# Patient Record
Sex: Female | Born: 1985 | Race: Black or African American | Hispanic: No | Marital: Married | Smoking: Never smoker
Health system: Southern US, Community
[De-identification: ages and names within clinical notes are randomized; demographics above are authoritative.]

## PROBLEM LIST (undated history)

## (undated) ENCOUNTER — Inpatient Hospital Stay (HOSPITAL_COMMUNITY): Payer: Medicaid Other

## (undated) DIAGNOSIS — O24419 Gestational diabetes mellitus in pregnancy, unspecified control: Secondary | ICD-10-CM

## (undated) HISTORY — DX: Gestational diabetes mellitus in pregnancy, unspecified control: O24.419

---

## 2017-12-23 LAB — CHG URINE PREGNANCY TEST: PREG TEST UR: POSITIVE

## 2018-01-09 ENCOUNTER — Encounter: Payer: Self-pay | Admitting: *Deleted

## 2018-01-27 ENCOUNTER — Encounter: Payer: Self-pay | Admitting: Advanced Practice Midwife

## 2018-01-28 ENCOUNTER — Encounter: Payer: Self-pay | Admitting: *Deleted

## 2018-02-06 LAB — AMB REFERRAL TO OB-GYN
DRUG SCREEN, URINE: NEGATIVE
Glucose, 1 hour: 90
PAP SMEAR: NEGATIVE
URINE CULTURE, OB: NEGATIVE

## 2018-02-06 LAB — OB RESULTS CONSOLE GC/CHLAMYDIA
Chlamydia: NEGATIVE
Gonorrhea: NEGATIVE

## 2018-02-06 LAB — OB RESULTS CONSOLE VARICELLA ZOSTER ANTIBODY, IGG: Varicella: IMMUNE

## 2018-02-06 LAB — SICKLE CELL SCREEN: Sickle Cell Screen: NORMAL

## 2018-02-06 LAB — OB RESULTS CONSOLE HEPATITIS B SURFACE ANTIGEN: Hepatitis B Surface Ag: NEGATIVE

## 2018-02-06 LAB — CYSTIC FIBROSIS DIAGNOSTIC STUDY: Interpretation-CFDNA:: NEGATIVE

## 2018-02-06 LAB — OB RESULTS CONSOLE RPR: RPR: NONREACTIVE

## 2018-02-06 LAB — OB RESULTS CONSOLE ABO/RH: RH Type: POSITIVE

## 2018-02-06 LAB — OB RESULTS CONSOLE HGB/HCT, BLOOD
HCT: 36 (ref 29–41)
Hemoglobin: 11.7

## 2018-02-06 LAB — OB RESULTS CONSOLE ANTIBODY SCREEN: Antibody Screen: NEGATIVE

## 2018-02-06 LAB — OB RESULTS CONSOLE HIV ANTIBODY (ROUTINE TESTING): HIV: NONREACTIVE

## 2018-02-06 LAB — OB RESULTS CONSOLE PLATELET COUNT: Platelets: 291

## 2018-02-06 LAB — OB RESULTS CONSOLE RUBELLA ANTIBODY, IGM: Rubella: IMMUNE

## 2018-02-17 ENCOUNTER — Encounter: Payer: Self-pay | Admitting: Advanced Practice Midwife

## 2018-04-28 LAB — OB RESULTS CONSOLE HGB/HCT, BLOOD
HCT: 36 (ref 29–41)
Hemoglobin: 11.9

## 2018-04-28 LAB — AMB REFERRAL TO OB-GYN: GLUCOSE: 157

## 2018-04-28 LAB — OB RESULTS CONSOLE RPR: RPR: NONREACTIVE

## 2018-05-01 LAB — AMB REFERRAL TO OB-GYN: Glucose 3 Hour: 99

## 2018-05-06 ENCOUNTER — Telehealth: Payer: Self-pay | Admitting: Family Medicine

## 2018-05-06 ENCOUNTER — Telehealth: Payer: Self-pay | Admitting: General Practice

## 2018-05-06 NOTE — Telephone Encounter (Signed)
Attempted to call patient to let her know of the office restrictions due to the coronavirus. No answer, left detailed message with the restrictions along with the office number if needing to reschedule.

## 2018-05-06 NOTE — Telephone Encounter (Signed)
Patient called and left message stating she had a missed call from [place] so she is returning our call. Called patient and discussed that was just a reminder call for her appt. Informed her of appt time & reviewed current visitor restrictions. Patient verbalized understanding and asked if she needed to bring anything with her. Told patient just a picture ID and insurance card if she has one. Patient verbalized understanding & had no questions.

## 2018-05-08 ENCOUNTER — Encounter: Payer: Self-pay | Attending: Obstetrics & Gynecology | Admitting: *Deleted

## 2018-05-08 ENCOUNTER — Other Ambulatory Visit: Payer: Self-pay

## 2018-05-08 ENCOUNTER — Ambulatory Visit: Payer: Self-pay | Admitting: *Deleted

## 2018-05-08 DIAGNOSIS — Z713 Dietary counseling and surveillance: Secondary | ICD-10-CM | POA: Insufficient documentation

## 2018-05-08 DIAGNOSIS — R7302 Impaired glucose tolerance (oral): Secondary | ICD-10-CM

## 2018-05-08 DIAGNOSIS — O24410 Gestational diabetes mellitus in pregnancy, diet controlled: Secondary | ICD-10-CM | POA: Insufficient documentation

## 2018-05-08 LAB — AMB REFERRAL TO OB-GYN: Maternal quad screen for MFM: NEGATIVE

## 2018-05-08 NOTE — Progress Notes (Signed)
  Patient was seen on 05/08/2018 for Gestational Diabetes self-management. EDD 07/09/2018. Patient states no history of GDM. Diet history obtained. Patient eats good variety of all food groups but only 2 meals a day with larger servings at each. Beverages include only water. She is limiting her walking to inside her home due to concern about being outside with Covid 19.  The following learning objectives were met by the patient :   States the definition of Gestational Diabetes  States why dietary management is important in controlling blood glucose  Describes the effects of carbohydrates on blood glucose levels  Demonstrates ability to create a balanced meal plan  Demonstrates carbohydrate counting   States when to check blood glucose levels  Demonstrates proper blood glucose monitoring techniques  States the effect of stress and exercise on blood glucose levels  States the importance of limiting caffeine and abstaining from alcohol and smoking  Plan:  Aim for 3 Carb Choices per meal (45 grams) +/- 1 either way  Aim for 1-2 Carbs per snack Begin reading food labels for Total Carbohydrate of foods If OK with your MD, consider  increasing your activity level by walking, Arm Chair Exercises or other activity daily as tolerated Begin checking BG before breakfast and 2 hours after first bite of breakfast, lunch and dinner as directed by MD  Bring Log Book/Sheet and meter to every medical appointment OR use Baby Scripts (see below) Baby Scripts:  Patient was introduced to [employer] but states she prefers to record BG on Log Sheet Take medication if directed by MD  Blood glucose monitor given: True Track Lot # [id number] Exp: 09/19/2019 Blood glucose reading: 95 mg/dl  Patient instructed to monitor glucose levels: FBS: 60 - 95 mg/dl 2 hour: <120 mg/dl  Patient received the following handouts:  Nutrition Diabetes and Pregnancy  Carbohydrate Counting List  BG Log Sheet  Patient  will be seen for follow-up as needed.

## 2018-05-15 ENCOUNTER — Telehealth: Payer: Self-pay | Admitting: Obstetrics and Gynecology

## 2018-05-15 ENCOUNTER — Encounter: Payer: Self-pay | Admitting: Obstetrics and Gynecology

## 2018-05-15 ENCOUNTER — Telehealth: Payer: Self-pay | Admitting: Lactation Services

## 2018-05-15 ENCOUNTER — Ambulatory Visit (INDEPENDENT_AMBULATORY_CARE_PROVIDER_SITE_OTHER): Payer: Self-pay | Admitting: Obstetrics and Gynecology

## 2018-05-15 ENCOUNTER — Other Ambulatory Visit: Payer: Self-pay

## 2018-05-15 DIAGNOSIS — Z3A32 32 weeks gestation of pregnancy: Secondary | ICD-10-CM

## 2018-05-15 DIAGNOSIS — O0990 Supervision of high risk pregnancy, unspecified, unspecified trimester: Secondary | ICD-10-CM | POA: Insufficient documentation

## 2018-05-15 DIAGNOSIS — O24410 Gestational diabetes mellitus in pregnancy, diet controlled: Secondary | ICD-10-CM | POA: Insufficient documentation

## 2018-05-15 DIAGNOSIS — Z98891 History of uterine scar from previous surgery: Secondary | ICD-10-CM | POA: Insufficient documentation

## 2018-05-15 DIAGNOSIS — O0993 Supervision of high risk pregnancy, unspecified, third trimester: Secondary | ICD-10-CM

## 2018-05-15 DIAGNOSIS — O24419 Gestational diabetes mellitus in pregnancy, unspecified control: Secondary | ICD-10-CM

## 2018-05-15 NOTE — Progress Notes (Signed)
Patient reports mild back pain. Patient signed up for BRX- BP cuff given in office.

## 2018-05-15 NOTE — Telephone Encounter (Signed)
Spoke with Pt while at her OB appt. Pt plans to breast and formula feed. Pt BF her 33 yo previously. She gave formula in the hospital as she felt she did not have enough milk. Reviewed supply and demand and enc mom to BF exclusively if possible. Discussed with Corona Virus and decreased supply of Formula currently to try to BF as much as possible to promote the best milk supply she can.   Mom does not have a pump, discussed getting a Manual pump in the hospital. Pt is applying for Medicaid, she does not plan to apply for [facility].   Discussed IP/OP Lactation Services available. Mom reports she has no further questions/concerns as needed.

## 2018-05-15 NOTE — Addendum Note (Signed)
Addended by: Kathee Delton on: 05/15/2018 02:57 PM   Modules accepted: Orders

## 2018-05-15 NOTE — Telephone Encounter (Signed)
Called the patient to inform of COVID19 restrictions.

## 2018-05-15 NOTE — Patient Instructions (Signed)
AREA PEDIATRIC/FAMILY PRACTICE PHYSICIANS  [place] ([address]) . [facility] o Chambliss, MD; Eniola, MD; Hale, MD; Hensel, MD; McDiarmid, MD; McIntyer, MD; Neal, MD; Walden, MD o [address] [place], [place] [address] o [phone] o Mon-Fri 8:30-12:30, 1:30-5:00 o Providers come to see babies at [facility] o Accepting Medicaid . [facility] o Limited providers who accept newborns: Koirala, MD; Morrow, MD; Wolters, MD o [address], [place], [place] [address] o [phone] o Mon-Fri 8:00-5:30 o Babies seen by providers at [facility] o Does NOT accept Medicaid o Please call early in hospitalization for appointment (limited availability)  . [facility] o [place], MD o [address] [place], [place] [address] o [phone] o Mon, Tue, Thur, Fri 8:30-5:00, Wed 10:00-7:00 (closed 1-2pm) o Babies seen by [facility] providers o Accepting Medicaid . [facility] o Rubin, MD o [address], [place], [place] [address] o [phone] o Mon-Fri 8:30-5:00, Sat 8:30-12:00 o Provider comes to see babies at [facility] o Accepting Medicaid o Must have been referred from current patients or contacted office prior to delivery . [facility] ([facility]) o Brown, MD; Chandler, MD; Ettefagh, MD; Grant, MD; Lester, MD; McCormick, MD; McQueen, MD; Prose, MD; Simha, MD; Stanley, MD; Stryffeler, NP; Tebben, NP o [address], [place], [place] [address] o [phone] o Mon, Tue, Thur, Fri 8:30-5:30, Wed 9:30-5:30, Sat 8:30-12:30 o Babies seen by [facility] providers o Accepting Medicaid o Only accepting infants of first-time parents or siblings of current patients o Hospital discharge coordinator will make follow-up appointment . Jack Amos o [address],  [place], [place]  [address] o [phone]   Fax - [phone] . [facility] o [address], [place], [place]  [address] o Phone - [phone]   Fax - [phone] . Shilpa Gosrani o [address], [place], [place]  [address] o [phone]  [place] ([address]) . [facility] o Bates, MD; Brassfield, MD; Cooper, Cox, MD; MD; Davis, MD; Dovico, MD; Ettefaugh, MD; Little, MD; Lowe, MD; Keiffer, MD; Melvin, MD; Sumner, MD; Williams, MD o [address], [place], [place] [address] o [phone] o Mon-Fri 8:30-5:00 (extended evenings Mon-Thur as needed), Sat-Sun 10:00-1:00 o Providers come to see babies at [facility] o Accepting Medicaid for families of first-time babies and families with all children in the household age 3 and under. Must register with office prior to making appointment (M-F only). . [facility] o Henson, NP; Knapp, MD; Lalonde, MD; Tysinger, PA o [address] [place], [place] [address] o [phone] o Mon-Fri 8:00-5:00 o Babies seen by providers at [facility] o Does NOT accept Medicaid/Commercial Insurance Only . [facility] ([facility]  o Artis, MD; Barnes, MD; Bratton, MD; Coccaro, MD; Lockett Gardner, MD; Kramer, MD; Marshall, MD; Netherton, MD; Poleto, MD; Skinner, MD o [address] [place], [place] [address] o [phone] o Mon-Fri 8:30-5:30, Sat (Oct.-Mar.) 9:00-1:00 o Babies seen by providers at [facility] o Accepting Medicaid  [place]  ([address]) . [facility]  o Reid, MD; Warner, MD o [address], , [place] [address] o [phone] o Mon-Fri 8:30-5:00, Sat 8:30-12:00 o Providers come to see babies at [facility] o Does NOT accept Medicaid . [facility] o Becker, PA; Hagler, MD; Scifres, PA; Sun, MD; Swayne, MD o [address],  [place], [place] [address] o [phone] o Mon-Fri 8:00-5:00 o Babies seen by providers at [facility] o Does NOT accept Medicaid o Only accepting babies of parents who are patients o Please call early in hospitalization for appointment (limited availability) . [facility] o Clark, MD; Frye, MD; Kelleher, MD; Mack, NP; Miller, MD; O'Keller, MD; Patterson, NP; Pudlo, MD; Puzio, MD; Thomas, MD; Tucker, MD; Twiselton, MD o [address], [place], [place] [address] o [phone] o Mon-Fri 8:00-5:00, Sat 9:00-12:00 o Providers come to see babies at [facility] o Does NOT accept Medicaid  [place] ([address]) . [facility] o Limited providers accepting new patients: Brake, NP; Wharton, PA o [address], [place], [place] [address] o [phone] o Mon-Fri 8:00-5:00 o Babies seen by providers at [facility] o Does NOT accept Medicaid o Only accepting babies of parents who are patients o Please call early in hospitalization for appointment (limited availability) . [facility] o Gay, MD; Quinlan, MD o [address] [place], [place] [address] o [phone] (press 1 to schedule appointment) o Mon-Fri 8:00-5:00 o Providers come to see babies at [facility] o Does NOT accept Medicaid . [facility] o Mazer, MD o [address] [place], [place] [address] o [phone] o Mon-Fri 8:30-5:00 (lunch 12:30-1:00), extended hours by appointment only Wed 5:00-6:30 o Babies seen by [facility] providers o Accepting Medicaid . [facility] o Banks, MD; Jordan, MD; Koberlein, MD o [address], [place], [place] [address] o [phone] o Mon-Fri 8:00-5:00 o Babies seen by [facility] providers o Does NOT accept Medicaid . [facility] o Parker, MD; Hunter, MD; Wallace, DO o [address] [place], [place]  [address] o [phone] o Mon-Fri 8:00-5:00 o Babies seen by [facility] providers o Does NOT accept Medicaid . [facility] o Brandon, PA; Brecken, PA; Christy, NP; Dees, MD; DeClaire, MD; DeWeese, MD; Hansen, NP; Mills, NP; Parrish, NP; Smoot, NP; Summer, MD; Vapne, MD o [address] [place], [place] [address] o [phone] o Mon-Fri 8:30-5:00, Sat 10:00-1:00 o Providers come to see babies at [facility] o Does NOT accept Medicaid o Free prenatal information session Tuesdays at 4:45pm . [facility] o Bouska, MD; Gordon, PA; Jeffery, PA; Weber, PA o [address] [place] [address] o [phone] o Mon-Fri 7:30-5:30 o Babies seen by [facility] providers . [facility] o [address], [place], [place]  [address] o [phone]   Fax - [phone]  [place] ([address]) . [facility] o [place] o [address] [place], [place] [address] o [phone] o Mon-Thur 8:00-6:00 o Providers come to see babies at [facility] o Accepting Medicaid . [facility] o Anderson, NP; Badger, MD; Beal, PA; Spencer, PA o [address] [place], [place] [address] o [phone] o Mon-Thur 7:30-7:30, Fri 7:30-4:30 o Babies seen by [facility] providers o Accepting Medicaid . [facility] o Agbuya, MD; Klett, NP; Romgoolam, MD o [address], [place], [place] [address] o [phone] o Mon-Fri 8:30-5:00, Sat 8:30-12:00 o Providers come to see babies at [facility] o Accepting Medicaid o Must have "Meet & Greet" appointment at office prior to delivery . [facility] o McCord,   MD; Wallace, MD; Wood, MD o [address], [place], [place] [address] o [phone] o Mon-Wed 8:00-6:00, Thur-Fri 8:00-5:00, Sat 9:00-12:00 o Providers come to  see babies at [facility] o Does NOT accept Medicaid o Only accepting siblings of current patients . [facility]  o [address], [place], [place]  [address] o [phone]   Fax - [phone] . [facility] o [address], [place], [place]  [address] o [phone]   Fax - [phone]  [place] ([address] & [address]) . [facility] o Cirigliano, DO; Matthews, DO o [address] [place], [place] [address] o [phone] o Mon-Fri 7:00-5:00 o Babies seen by [facility] providers o Does NOT accept Medicaid . [facility] o Briscoe, MD; Howley, PA; Moreira, PA o [address], [place], [place] [address] o [phone] o Mon-Fri 8:00-5:00 o Babies seen by [facility] providers o Accepting Medicaid . [facility] o Boyd, MD; Church, PA; Jones, NP; Osborn, PA o [address] [place] [address], , [place] [address] o [phone] o Mon-Fri 8:00-5:00 o Babies seen by providers at [facility] o Accepting Medicaid  [place] ([address]) . [facility] [place] o [place], DO o [address] [place], [place] [address] o [phone] o Mon-Fri 8:00-5:00 o Babies seen by [facility] providers o Does NOT accept Medicaid o Limited availability, please call early in hospitalization to schedule follow-up . [facility] o Calderon, PA; Cummings, MD; Dillard, MD; Martin, PA; Olson, MD; VanDeven, PA o [address], [place], [place] [address] o [phone] o Mon-Fri 8:30-5:00, Sat 9:00-12:00 o Babies seen by providers at [facility] o Accepting Medicaid o Please register online then schedule online or call office o [email or web address] . [facility] o Hunter, NP; Kumar, MD; Martin Rogers, PA o [address], [place], [place] [address] o [phone] o Mon-Fri 8:00-5:00 o Babies seen by providers at [facility] o Accepting Medicaid . [facility] ([facility] o Hallowell, MD; Kristi Fleenor, NP; West, MD o [address], [place], [place] [address] o [phone] o Mon-Fri 8:00-5:30, Sat&Sun by appointment (phones open at 8:30) o Babies seen by [facility] providers o Accepting Medicaid o Must be a first-time baby or sibling of current patient . [facility] [place]  o [address], [place], [place]  [address] o [phone]   Fax - [phone]  [place] ([address]) . [facility] o Brown, PA; Cowen, PA; Rice, MD; Helton, PA; Spry, MD o [address] [place], [place] [address] o [phone] o Mon-Thur 8:00-7:00, Fri 8:00-5:00, Sat 8:00-12:00, Sun 9:00-12:00 o Babies seen by [facility] providers o Accepting Medicaid . [facility] [place] o Coe-Goins, MD; Marshall, MD; Pierre-Louis, MD o [address] [place] [address], [place], [place] [address] o [phone] o Mon-Thur 8:00-5:00 o Babies seen by providers at [facility] o Accepting Medicaid . [facility] o Bratton, MD; Coe-Goins, MD; Hayes, MD; Lewis, MD; List, MD; Lott, MD; Marshall, MD; Moran, MD; O'Neal, MD; Pierre-Louis, MD; Pitonzo, MD; Scholer, MD; Spangle, MD o [address] [place],    [address] o [phone] o Mon-Fri 8:00-5:30, Sat (Oct.-Mar.) 9:00-1:00 o Babies seen by providers at [facility] o Accepting Medicaid o Must fill out new patient packet, available online at [email or web address] . [facility] ([facility] o Friddle, NP; Harris, NP; Kelly, NP; Logan, MD;  Melvin, PA; Poth, MD; Ramadoss, MD; Stanton, NP o [address], [place], [place] [address] o [phone] o Mon-Thur 8:00-5:30, Fri 8:00-5:00 o Babies seen by providers at [facility] o Accepting Medicaid  [place] ([address]) . [facility] o Dixon, PA; Roanoke, MD; Pickard, MD; Tapia, PA o [address], [place], [place] [address] o [phone] o Mon-Fri 8:00-5:00 o Babies seen by providers at [facility] o Accepting Medicaid   [place] ([address]) . [facility] [place] o Masneri, DO; Meyers, MD; Nelson, PA o [address], [place], [place] [address] o [phone] o Mon-Fri 8:00-5:00 o Babies seen by providers at [facility] o Does NOT accept Medicaid o Limited appointment availability, please call early in hospitalization  . [facility] [place] o Kunedd, DO; McGowen, MD o [address], [place], [place] [address] o [phone] o Mon-Fri 8:00-5:00 o Babies seen by [facility] providers o Does NOT accept Medicaid . [facility] [place] o Cameron, MD; MacDonald, MD; Michaels, PA; Nayak, MD o [address] [place] [address], [place], [place] [address] o [phone] o Mon-Fri 8:00-5:00 o After hours clinic ([address] [place], [place] [address]) [phone] Mon-Fri 5:00-8:00, Sat 12:00-6:00, Sun 10:00-4:00 o Babies seen by [facility] providers o Accepting Medicaid . [facility] [place] o [address], [place], [place]  [address] o [phone]   Fax - [phone]  [place] ([address]) . [facility] [place] o Andy, MD o [address], [place], [place] [address] o [phone] o Mon-Fri 8:00-5:00 o Babies seen by [facility] providers o Does NOT accept Medicaid . [facility] [place] ([facility] [place]) o Eksir, MD o [address], [place],   [address] o [phone] o Mon-Thur 8:00-7:00, Fri 8:00-5:00, Sat 8:00-12:00 o Babies seen by providers at [facility] o Accepting Medicaid - but does not have vaccinations in office (must be received elsewhere) o Limited availability, please call early in hospitalization  [place] ([address]) . [facility]  o Charlene Flemming, MD o [address], [place]  [address] o [phone]  Fax [phone] Places to have your son circumcised:                                                                      [facility]     [phone]   $480 while you are in hospital         [facility]              [phone]   $269 by 4 wks                      [facility]                     [phone]   $  269 by 7 days [facility]                    [phone]   $269 by 4 wks [facility]             [phone]   $175 by 2 wks    These prices sometimes change but are roughly what you can expect to pay. Please call and confirm pricing.   Circumcision is considered an elective/non-medically necessary procedure. There are many reasons parents decide to have their sons circumsized. During the first year of life circumcised males have a reduced risk of urinary tract infections but after this year the rates between circumcised males and uncircumcised males are the same.  It is safe to have your son circumcised outside of the hospital and the places above perform them regularly.   Deciding about Circumcision in Baby Boys  (Up-to-date The Basics)  What is circumcision?   Circumcision is a surgery that removes the skin that covers the tip of the penis, called the "foreskin" Circumcision is usually done when a boy is between 1 and 10 days old. In the [place], circumcision is common. In some other countries, fewer boys are circumcised. Circumcision is a common tradition in some religions.  Should I have my baby boy circumcised?   There is no easy answer. Circumcision has some benefits. But it also has risks.  After talking with your doctor, you will have to decide for yourself what is right for your family.  What are the benefits of circumcision?   Circumcised boys seem to have slightly lower rates of: ?Urinary tract infections ?Swelling of the opening at the tip of the penis Circumcised men seem to have slightly lower rates of: ?Urinary tract infections ?Swelling of the opening at the tip of the penis ?Penis cancer ?HIV and other infections that you catch during sex ?Cervical cancer in the women they have sex with Even so, in the [place], the risks of these problems are small - even in boys and men who have not been circumcised. Plus, boys and men who are not circumcised can reduce these extra risks by: ?Cleaning their penis well ?Using condoms during sex  What are the risks of circumcision?  Risks include: ?Bleeding or infection from the surgery ?Damage to or amputation of the penis ?A chance that the doctor will cut off too much or not enough of the foreskin ?A chance that sex won't feel as good later in life Only about 1 out of every 200 circumcisions leads to problems. There is also a chance that your health insurance won't pay for circumcision.  How is circumcision done in baby boys?  First, the baby gets medicine for pain relief. This might be a cream on the skin or a shot into the base of the penis. Next, the doctor cleans the baby's penis well. Then he or she uses special tools to cut off the foreskin. Finally, the doctor wraps a bandage (called gauze) around the baby's penis. If you have your baby circumcised, his doctor or nurse will give you instructions on how to care for him after the surgery. It is important that you follow those instructions carefully.  

## 2018-05-15 NOTE — Progress Notes (Signed)
INITIAL PRENATAL VISIT NOTE  Subjective:  Laurie Walsh is a 33 y.o. G2P1001 at [redacted]w[redacted]d by LMP being seen today for her initial prenatal visit. This is a planned pregnancy. She and partner are happy with the pregnancy. She was using nothing for birth control previously. She has an obstetric history significant for 1CS in [place] for failure to progress. She has a medical history significant for n/a.  Patient reports no complaints.  Contractions: Not present. Vag. Bleeding: None.  Movement: Present. Denies leaking of fluid.   Past Medical History:  Diagnosis Date  . Medical history non-contributory     Past Surgical History:  Procedure Laterality Date  . CESAREAN SECTION      OB History  Gravida Para Term Preterm AB Living  2 1 1  0 0 1  SAB TAB Ectopic Multiple Live Births  0 0 0 0 1    # Outcome Date GA Lbr Len/2nd Weight Sex Delivery Anes PTL Lv  2 Current           1 Term 03/08/14 100w0d  8 lb (3.629 kg) F CS-Unspec  N     Social History   Socioeconomic History  . Marital status: Married    Spouse name: Not on file  . Number of children: Not on file  . Years of education: Not on file  . Highest education level: Not on file  Occupational History  . Not on file  Social Needs  . Financial resource strain: Not on file  . Food insecurity:    Worry: Not on file    Inability: Not on file  . Transportation needs:    Medical: Not on file    Non-medical: Not on file  Tobacco Use  . Smoking status: Never Smoker  . Smokeless tobacco: Never Used  Substance and Sexual Activity  . Alcohol use: Never    Frequency: Never  . Drug use: Never  . Sexual activity: Not Currently  Lifestyle  . Physical activity:    Days per week: Not on file    Minutes per session: Not on file  . Stress: Not on file  Relationships  . Social connections:    Talks on phone: Not on file    Gets together: Not on file    Attends religious service: Not on file    Active member of club or  organization: Not on file    Attends meetings of clubs or organizations: Not on file    Relationship status: Not on file  Other Topics Concern  . Not on file  Social History Narrative  . Not on file    Family History  Problem Relation Age of Onset  . Diabetes Father      Current Outpatient Medications:  .  prenatal vitamin w/FE, FA (PRENATAL 1 + 1) 27-1 MG TABS tablet, Take 1 tablet by mouth daily at 12 noon., Disp: , Rfl:   No Known Allergies  Review of Systems: Negative except for what is mentioned in HPI.  Objective:   Vitals:   05/15/18 1358  BP: 125/74  Pulse: 97  Weight: 195 lb (88.5 kg)    Fetal Status: Fetal Heart Rate (bpm): 162   Movement: Present     Physical Exam: BP 125/74   Pulse 97   Wt 195 lb (88.5 kg)   LMP 10/05/2017 (Exact Date)  CONSTITUTIONAL: Well-developed, well-nourished female in no acute distress.  NEUROLOGIC: Alert and oriented to person, place, and time. Normal reflexes, muscle tone coordination. No  cranial nerve deficit noted. PSYCHIATRIC: Normal mood and affect. Normal behavior. Normal judgment and thought content. SKIN: Skin is warm and dry. No rash noted. Not diaphoretic. No erythema. No pallor. HENT:  Normocephalic, atraumatic, External right and left ear normal. Oropharynx is clear and moist EYES: Conjunctivae and EOM are normal. Pupils are equal, round, and reactive to light. No scleral icterus.  NECK: Normal range of motion, supple, no masses CARDIOVASCULAR: Normal heart rate noted, regular rhythm RESPIRATORY: Effort normal, no problems with respiration noted BREASTS: deferred ABDOMEN: Soft, nontender, nondistended, gravid. GU: deferred MUSCULOSKELETAL: Normal range of motion. EXT:  No edema and no tenderness. 2+ distal pulses.   Assessment and Plan:  Pregnancy: G2P1001 at [redacted]w[redacted]d by LMP  1. Supervision of high risk pregnancy, antepartum - CHL AMB BABYSCRIPTS SCHEDULE OPTIMIZATION  2. H/O: C-section Reviewed risks/benefits  of TOLAC versus RCS in detail. Patient counseled regarding potential vaginal delivery, chance of success, future implications, possible uterine rupture and need for urgent/emergent repeat cesarean. Counseled regarding potential need for repeat c-section for reasons unrelated to first c-section. Counseled regarding scheduled repeat cesarean including risks of bleeding, infection, damage to surrounding tissue, abnormal placentation, implications for future pregnancies. All questions answered.  Patient desires TOLAC, consent signed 05/15/2018.  3. Diet controlled gestational diabetes mellitus (GDM) in third trimester FG: < 95 except 1 PP: all < 120 Cont diet control Has growth [place] ordered   Preterm labor symptoms and general obstetric precautions including but not limited to vaginal bleeding, contractions, leaking of fluid and fetal movement were reviewed in detail with the patient.  Please refer to After Visit Summary for other counseling recommendations.   Return in about 2 weeks (around 05/29/2018) for OB visit.  Conan Bowens 05/15/2018 2:39 PM

## 2018-05-19 ENCOUNTER — Other Ambulatory Visit: Payer: Self-pay

## 2018-05-19 ENCOUNTER — Ambulatory Visit (HOSPITAL_COMMUNITY)
Admission: RE | Admit: 2018-05-19 | Discharge: 2018-05-19 | Disposition: A | Discharge status: Home / Self Care | Payer: Self-pay | Source: Ambulatory Visit | Attending: Obstetrics and Gynecology | Admitting: Obstetrics and Gynecology

## 2018-05-19 ENCOUNTER — Ambulatory Visit (HOSPITAL_COMMUNITY): Payer: Self-pay | Admitting: *Deleted

## 2018-05-19 ENCOUNTER — Telehealth: Payer: Self-pay | Admitting: *Deleted

## 2018-05-19 ENCOUNTER — Encounter (HOSPITAL_COMMUNITY): Payer: Self-pay

## 2018-05-19 VITALS — BP 122/70 | HR 89 | Temp 97.7°F | Ht 65.0 in

## 2018-05-19 DIAGNOSIS — Z363 Encounter for antenatal screening for malformations: Secondary | ICD-10-CM

## 2018-05-19 DIAGNOSIS — O34219 Maternal care for unspecified type scar from previous cesarean delivery: Secondary | ICD-10-CM

## 2018-05-19 DIAGNOSIS — O0990 Supervision of high risk pregnancy, unspecified, unspecified trimester: Secondary | ICD-10-CM | POA: Insufficient documentation

## 2018-05-19 DIAGNOSIS — O24410 Gestational diabetes mellitus in pregnancy, diet controlled: Secondary | ICD-10-CM | POA: Insufficient documentation

## 2018-05-19 DIAGNOSIS — O99213 Obesity complicating pregnancy, third trimester: Secondary | ICD-10-CM

## 2018-05-19 DIAGNOSIS — Z3A32 32 weeks gestation of pregnancy: Secondary | ICD-10-CM

## 2018-05-19 NOTE — Telephone Encounter (Signed)
Laurie Walsh called back . She states she went to the pharmacy and they didn't have a RX for her for PNV.  She also states she still has a bottle of PNV she got from the health department. I informed her she can take the PNV from the health department and when she has a few left to let us know and we can give her a prescription to take to the health department to get her prenatal vitamins for free or I can sent RX to [facility] ; but she will  Have to pay for them. She states she will take the PNV from the health dept and let us know when she needs more.

## 2018-05-19 NOTE — Telephone Encounter (Signed)
Patient left a voicemail today she wants to know if she should taking PNV she is taking and take the ones we gave her.

## 2018-05-19 NOTE — Telephone Encounter (Signed)
I called Olubumi back and left a message I am returning your call and not sure if I understood your question. Please call us back and leave a detailed message.

## 2018-05-20 NOTE — Progress Notes (Unsigned)
Received Baby Scripts Alert regarding pts Glucose readings:

## 2018-05-22 ENCOUNTER — Telehealth: Payer: Self-pay | Admitting: Family Medicine

## 2018-05-22 ENCOUNTER — Telehealth: Payer: Self-pay

## 2018-05-22 NOTE — Telephone Encounter (Signed)
Patient called in wanting to know information about her financial application that she had turned in on her last visit (3/26). She had called billing because she has been receiving bills and she wanted to know if she should pay them or not. Billing let her know that they had not received her papers yet regarding her application. Informed patient that it normally takes a couple of weeks for the application to be set out and then 4-6 weeks to be processed for an approval or denial. Advised patient to give billing a call back to know she they would back pay for her visits or if she should pay for them. Patient verbalized understanding.

## 2018-05-22 NOTE — Telephone Encounter (Signed)
Pt called about financial papers and having bills still being sent to her home. Pt was really hard to understand  States she was given a phone number other than ours and questioned the process. Bennie Hind took over the call from this point on.

## 2018-06-03 ENCOUNTER — Encounter: Payer: Self-pay | Admitting: General Practice

## 2018-06-03 NOTE — Progress Notes (Unsigned)
Patient triggered in babyscripts for elevated blood sugars. Per chart review, patient has appt scheduled with Dr Marice Potter tomorrow. Will route to Dr Marice Potter.

## 2018-06-04 ENCOUNTER — Other Ambulatory Visit: Payer: Self-pay

## 2018-06-04 ENCOUNTER — Ambulatory Visit (INDEPENDENT_AMBULATORY_CARE_PROVIDER_SITE_OTHER): Payer: Self-pay | Admitting: Obstetrics & Gynecology

## 2018-06-04 ENCOUNTER — Telehealth: Payer: Self-pay

## 2018-06-04 VITALS — BP 108/71

## 2018-06-04 DIAGNOSIS — O0993 Supervision of high risk pregnancy, unspecified, third trimester: Secondary | ICD-10-CM

## 2018-06-04 DIAGNOSIS — O0990 Supervision of high risk pregnancy, unspecified, unspecified trimester: Secondary | ICD-10-CM

## 2018-06-04 DIAGNOSIS — O24410 Gestational diabetes mellitus in pregnancy, diet controlled: Secondary | ICD-10-CM

## 2018-06-04 DIAGNOSIS — Z98891 History of uterine scar from previous surgery: Secondary | ICD-10-CM

## 2018-06-04 DIAGNOSIS — Z3A34 34 weeks gestation of pregnancy: Secondary | ICD-10-CM

## 2018-06-04 NOTE — Progress Notes (Signed)
   TELEHEALTH VIRTUAL OBSTETRICS VISIT ENCOUNTER NOTE  I connected with Laurie Walsh on 06/04/18 at  3:55 PM EDT by telephone at home and verified that I am speaking with the correct person using two identifiers.   I discussed the limitations, risks, security and privacy concerns of performing an evaluation and management service by telephone and the availability of in person appointments. I also discussed with the patient that there may be a patient responsible charge related to this service. The patient expressed understanding and agreed to proceed.  Subjective:  Laurie Walsh is a 33 y.o. G66P1001 (35 yo daughter) at [redacted]w[redacted]d being followed for ongoing prenatal care.  She is currently monitored for the following issues for this low-risk pregnancy and has Supervision of high risk pregnancy, antepartum; H/O: C-section; and Gestational diabetes on their problem list.  Patient reports no complaints. Reports fetal movement. Denies any contractions, bleeding or leaking of fluid.   The following portions of the patient's history were reviewed and updated as appropriate: allergies, current medications, past family history, past medical history, past social history, past surgical history and problem list.   Objective:   General:  Alert, oriented and cooperative.   Mental Status: Normal mood and affect perceived. Normal judgment and thought content.  Rest of physical exam deferred due to type of encounter  Assessment and Plan:  Pregnancy: G2P1001 at [redacted]w[redacted]d 1. H/O: C-section - wants a TOLAC (signed consent)  2. Supervision of high risk pregnancy, antepartum - in person next week for cultures  3. Diet controlled gestational diabetes mellitus (GDM) in third trimester - says that her fastings are very low, 55 this morning - 2 hour PCs are generally normal  Preterm labor symptoms and general obstetric precautions including but not limited to vaginal bleeding, contractions, leaking of fluid and  fetal movement were reviewed in detail with the patient.  I discussed the assessment and treatment plan with the patient. The patient was provided an opportunity to ask questions and all were answered. The patient agreed with the plan and demonstrated an understanding of the instructions. The patient was advised to call back or seek an in-person office evaluation/go to MAU at [facility] for any urgent or concerning symptoms. Please refer to After Visit Summary for other counseling recommendations.   I provided 8 minutes of non-face-to-face time during this encounter.  No follow-ups on file.  Future Appointments  Date Time Provider Department Center  06/04/2018  3:55 PM Allie Bossier, MD [facility] WOC    Allie Bossier, MD Center for [facility], [facility]

## 2018-06-04 NOTE — Telephone Encounter (Signed)
Received call from Bridgette regarding Babyscripts alert:

## 2018-06-05 NOTE — Progress Notes (Unsigned)
Received Baby Script alert for Gluco Reading:

## 2018-06-13 ENCOUNTER — Other Ambulatory Visit (HOSPITAL_COMMUNITY)
Admission: RE | Admit: 2018-06-13 | Discharge: 2018-06-13 | Disposition: A | Discharge status: Home / Self Care | Payer: Medicaid Other | Source: Ambulatory Visit | Attending: Obstetrics & Gynecology | Admitting: Obstetrics & Gynecology

## 2018-06-13 ENCOUNTER — Other Ambulatory Visit: Payer: Self-pay

## 2018-06-13 ENCOUNTER — Ambulatory Visit (INDEPENDENT_AMBULATORY_CARE_PROVIDER_SITE_OTHER): Payer: Medicaid Other | Admitting: Obstetrics & Gynecology

## 2018-06-13 VITALS — BP 122/79 | HR 90 | Wt 201.0 lb

## 2018-06-13 DIAGNOSIS — Z3A36 36 weeks gestation of pregnancy: Secondary | ICD-10-CM

## 2018-06-13 DIAGNOSIS — O24410 Gestational diabetes mellitus in pregnancy, diet controlled: Secondary | ICD-10-CM

## 2018-06-13 DIAGNOSIS — O0990 Supervision of high risk pregnancy, unspecified, unspecified trimester: Secondary | ICD-10-CM | POA: Diagnosis present

## 2018-06-13 DIAGNOSIS — Z98891 History of uterine scar from previous surgery: Secondary | ICD-10-CM

## 2018-06-13 MED ORDER — PRENATAL PLUS 27-1 MG PO TABS: 1.0000 | ORAL_TABLET | Freq: Every day | ORAL | 6 refills | Status: DC

## 2018-06-13 NOTE — Progress Notes (Signed)
   PRENATAL VISIT NOTE  Subjective:  Laurie Walsh is a 33 y.o. G2P1001 at [redacted]w[redacted]d being seen today for ongoing prenatal care.  She is currently monitored for the following issues for this high risk pregnancy and has Supervision of high risk pregnancy, antepartum; H/O: C-section; and Gestational diabetes on their problem list.  Patient reports no problems   .  .   . Denies leaking of fluid.   The following portions of the patient's history were reviewed and updated as appropriate: allergies, current medications, past family history, past medical history, past social history, past surgical history and problem list.   Objective:  There were no vitals filed for this visit.  Fetal Status:           General:  Alert, oriented and cooperative. Patient is in no acute distress.  Skin: Skin is warm and dry. No rash noted.   Cardiovascular: Normal heart rate noted  Respiratory: Normal respiratory effort, no problems with respiration noted  Abdomen: Soft, gravid, appropriate for gestational age.        Pelvic: Patient declined  Extremities: Normal range of motion.     Mental Status: Normal mood and affect. Normal behavior. Normal judgment and thought content.   Assessment and Plan:  Pregnancy: G2P1001 at [redacted]w[redacted]d 1. H/O: C-section - she wants a TOLAC, consent signed  2. Supervision of high risk pregnancy, antepartum  - Culture, beta strep (group b only) - GC/Chlamydia probe amp ([place])not at [facility]  3. Diet controlled gestational diabetes mellitus (GDM) in third trimester - fastings are all low, so I told her to stop checking them. Almost all 2 hour PCs are normal.  Preterm labor symptoms and general obstetric precautions including but not limited to vaginal bleeding, contractions, leaking of fluid and fetal movement were reviewed in detail with the patient. Please refer to After Visit Summary for other counseling recommendations.   No follow-ups on file.  No future appointments.   Allie Bossier, MD

## 2018-06-16 LAB — CULTURE, BETA STREP (GROUP B ONLY): Strep Gp B Culture: POSITIVE — AB

## 2018-06-16 LAB — GC/CHLAMYDIA PROBE AMP (~~LOC~~) NOT AT ARMC
Chlamydia: NEGATIVE
Neisseria Gonorrhea: NEGATIVE

## 2018-06-18 ENCOUNTER — Telehealth: Payer: Self-pay | Admitting: Medical

## 2018-06-18 NOTE — Telephone Encounter (Signed)
Called the patient to inform of upcoming appointment. she stated she has the app downloaded as she completed the download on her last visit. The patient verbalized understanding.

## 2018-06-19 ENCOUNTER — Encounter: Payer: Self-pay | Admitting: Obstetrics & Gynecology

## 2018-06-19 DIAGNOSIS — O99820 Streptococcus B carrier state complicating pregnancy: Secondary | ICD-10-CM | POA: Insufficient documentation

## 2018-06-20 ENCOUNTER — Ambulatory Visit (INDEPENDENT_AMBULATORY_CARE_PROVIDER_SITE_OTHER): Payer: Medicaid Other | Admitting: Obstetrics and Gynecology

## 2018-06-20 ENCOUNTER — Encounter: Payer: Self-pay | Admitting: Obstetrics and Gynecology

## 2018-06-20 ENCOUNTER — Other Ambulatory Visit: Payer: Self-pay

## 2018-06-20 DIAGNOSIS — O99820 Streptococcus B carrier state complicating pregnancy: Secondary | ICD-10-CM

## 2018-06-20 DIAGNOSIS — Z3A37 37 weeks gestation of pregnancy: Secondary | ICD-10-CM | POA: Diagnosis not present

## 2018-06-20 DIAGNOSIS — Z98891 History of uterine scar from previous surgery: Secondary | ICD-10-CM | POA: Diagnosis not present

## 2018-06-20 DIAGNOSIS — O0990 Supervision of high risk pregnancy, unspecified, unspecified trimester: Secondary | ICD-10-CM

## 2018-06-20 DIAGNOSIS — O0993 Supervision of high risk pregnancy, unspecified, third trimester: Secondary | ICD-10-CM | POA: Diagnosis not present

## 2018-06-20 DIAGNOSIS — O24410 Gestational diabetes mellitus in pregnancy, diet controlled: Secondary | ICD-10-CM

## 2018-06-20 NOTE — Progress Notes (Signed)
   TELEHEALTH VIRTUAL OBSTETRICS PRENATAL VISIT ENCOUNTER NOTE  I connected with Laurie Walsh on 06/20/18 at  8:55 AM EDT by WebEx at home and verified that I am speaking with the correct person using two identifiers.   I discussed the limitations, risks, security and privacy concerns of performing an evaluation and management service by telephone and the availability of in person appointments. I also discussed with the patient that there may be a patient responsible charge related to this service. The patient expressed understanding and agreed to proceed. Subjective:  Laurie Walsh is a 33 y.o. G2P1001 at [redacted]w[redacted]d being seen today for ongoing prenatal care.  She is currently monitored for the following issues for this high-risk pregnancy and has Supervision of high risk pregnancy, antepartum; H/O: C-section; Gestational diabetes; and GBS (group B Streptococcus carrier), +RV culture, currently pregnant on their problem list.  Patient reports general discomforts of pregnancy.  Reports fetal movement. Contractions: Not present. Vag. Bleeding: None.  Movement: Present. Denies any contractions, bleeding or leaking of fluid.   The following portions of the patient's history were reviewed and updated as appropriate: allergies, current medications, past family history, past medical history, past social history, past surgical history and problem list.   Objective:  There were no vitals filed for this visit.  Fetal Status:     Movement: Present     General:  Alert, oriented and cooperative. Patient is in no acute distress.  Respiratory: Normal respiratory effort, no problems with respiration noted  Mental Status: Normal mood and affect. Normal behavior. Normal judgment and thought content.  Rest of physical exam deferred due to type of encounter  Assessment and Plan:  Pregnancy: G2P1001 at [redacted]w[redacted]d 1. Supervision of high risk pregnancy, antepartum Stable Labor precautions - [place] MFM OB FOLLOW UP;  Future  2. Diet controlled gestational diabetes mellitus (GDM) in third trimester Not checking fasting CBG's as per Dr Marice Potter. CBG's in goal range for the most part Continue with diet and CBG monitoring  3. H/O: C-section Desires TOLAC Consent signed  4. GBS (group B Streptococcus carrier), +RV culture, currently pregnant Tx while in labor  Term labor symptoms and general obstetric precautions including but not limited to vaginal bleeding, contractions, leaking of fluid and fetal movement were reviewed in detail with the patient. I discussed the assessment and treatment plan with the patient. The patient was provided an opportunity to ask questions and all were answered. The patient agreed with the plan and demonstrated an understanding of the instructions. The patient was advised to call back or seek an in-person office evaluation/go to MAU at [facility] for any urgent or concerning symptoms. Please refer to After Visit Summary for other counseling recommendations.   I provided 11 minutes of face-to-face via WebEx time during this encounter.  Return in about 1 year (around 06/20/2019) for OB visit, televisit.  Future Appointments  Date Time Provider Department Center  06/24/2018 11:00 AM [facility] NURSE [facility]  06/24/2018 11:00 AM WH[facility]MFC [place] 3 [facility] MFC-US  06/27/2018 10:35 AM Marseilles Bing, MD [facility]OC  07/04/2018 10:55 AM Allie Bossier, MD [facility]OC  07/11/2018  9:35 AM Conan Bowens, MD [facility]    Hermina Staggers, MD Center for [facility], [facility]

## 2018-06-24 ENCOUNTER — Other Ambulatory Visit: Payer: Self-pay

## 2018-06-24 ENCOUNTER — Ambulatory Visit (HOSPITAL_COMMUNITY): Payer: Medicaid Other | Admitting: *Deleted

## 2018-06-24 ENCOUNTER — Ambulatory Visit (HOSPITAL_COMMUNITY)
Admission: RE | Admit: 2018-06-24 | Discharge: 2018-06-24 | Disposition: A | Discharge status: Home / Self Care | Payer: Medicaid Other | Source: Ambulatory Visit | Attending: Obstetrics and Gynecology | Admitting: Obstetrics and Gynecology

## 2018-06-24 ENCOUNTER — Encounter (HOSPITAL_COMMUNITY): Payer: Self-pay

## 2018-06-24 VITALS — BP 129/80 | HR 81 | Temp 98.3°F

## 2018-06-24 DIAGNOSIS — O24410 Gestational diabetes mellitus in pregnancy, diet controlled: Secondary | ICD-10-CM

## 2018-06-24 DIAGNOSIS — O34219 Maternal care for unspecified type scar from previous cesarean delivery: Secondary | ICD-10-CM | POA: Diagnosis not present

## 2018-06-24 DIAGNOSIS — O99213 Obesity complicating pregnancy, third trimester: Secondary | ICD-10-CM | POA: Diagnosis not present

## 2018-06-24 DIAGNOSIS — Z3A37 37 weeks gestation of pregnancy: Secondary | ICD-10-CM

## 2018-06-24 DIAGNOSIS — O0990 Supervision of high risk pregnancy, unspecified, unspecified trimester: Secondary | ICD-10-CM | POA: Insufficient documentation

## 2018-06-24 DIAGNOSIS — O0993 Supervision of high risk pregnancy, unspecified, third trimester: Secondary | ICD-10-CM

## 2018-06-26 ENCOUNTER — Telehealth: Payer: Self-pay | Admitting: Obstetrics and Gynecology

## 2018-06-26 NOTE — Telephone Encounter (Signed)
Called the patient to confirm the appointment, the patient verbalized understating. °

## 2018-06-27 ENCOUNTER — Other Ambulatory Visit: Payer: Self-pay

## 2018-06-27 ENCOUNTER — Ambulatory Visit (INDEPENDENT_AMBULATORY_CARE_PROVIDER_SITE_OTHER): Payer: Medicaid Other | Admitting: Obstetrics and Gynecology

## 2018-06-27 DIAGNOSIS — Z98891 History of uterine scar from previous surgery: Secondary | ICD-10-CM | POA: Diagnosis not present

## 2018-06-27 DIAGNOSIS — Z3A38 38 weeks gestation of pregnancy: Secondary | ICD-10-CM

## 2018-06-27 DIAGNOSIS — O0993 Supervision of high risk pregnancy, unspecified, third trimester: Secondary | ICD-10-CM | POA: Diagnosis not present

## 2018-06-27 DIAGNOSIS — O0990 Supervision of high risk pregnancy, unspecified, unspecified trimester: Secondary | ICD-10-CM

## 2018-06-27 NOTE — Progress Notes (Addendum)
   TELEHEALTH VIRTUAL OBSTETRICS VISIT ENCOUNTER NOTE  I connected with Laurie Walsh on 06/27/18 at 10:35 AM EDT by telephone at home and verified that I am speaking with the correct person using two identifiers.   I discussed the limitations, risks, security and privacy concerns of performing an evaluation and management service by telephone and the availability of in person appointments. I also discussed with the patient that there may be a patient responsible charge related to this service. The patient expressed understanding and agreed to proceed.  Subjective:  Laurie Walsh is a 33 y.o. G2P1001 at [redacted]w[redacted]d being followed for ongoing prenatal care.  She is currently monitored for the following issues for this high-risk pregnancy and has Supervision of high risk pregnancy, antepartum; H/O: C-section; GDM (gestational diabetes mellitus), class A1; and GBS (group B Streptococcus carrier), +RV culture, currently pregnant on their problem list.  Patient reports no complaints. Reports fetal movement. Denies any contractions, bleeding or leaking of fluid.   The following portions of the patient's history were reviewed and updated as appropriate: allergies, current medications, past family history, past medical history, past social history, past surgical history and problem list.   Objective:  There were no vitals filed for this visit.  Babyscripts Data Reviewed:yes  General:  Alert, oriented and cooperative.   Mental Status: Normal mood and affect perceived. Normal judgment and thought content.  Rest of physical exam deferred due to type of encounter  Assessment and Plan:  Pregnancy: G2P1001 at [redacted]w[redacted]d 1. Supervision of high risk pregnancy, antepartum Routine care.  2. H/O: C-section Desires tolac only if goes into labor before c/s date.   3. GDMa1 To start am fasting; was told to only check 2h PP. Pt gives normal values. Not able to upload into BS for some reason   term labor  symptoms and general obstetric precautions including but not limited to vaginal bleeding, contractions, leaking of fluid and fetal movement were reviewed in detail with the patient.  I discussed the assessment and treatment plan with the patient. The patient was provided an opportunity to ask questions and all were answered. The patient agreed with the plan and demonstrated an understanding of the instructions. The patient was advised to call back or seek an in-person office evaluation/go to MAU at [facility] for any urgent or concerning symptoms. Please refer to After Visit Summary for other counseling recommendations.   I provided 10 minutes of non-face-to-face time during this encounter. The visit was conducted via Webex-medicine  No follow-ups on file.  Future Appointments  Date Time Provider Department Center  07/04/2018 10:55 AM Allie Bossier, MD [facility]  07/11/2018  9:35 AM Willodean Rosenthal, MD [facility]    Destrehan Bing, MD Center for [facility], [facility]

## 2018-06-30 ENCOUNTER — Encounter: Payer: Self-pay | Admitting: *Deleted

## 2018-06-30 ENCOUNTER — Telehealth (INDEPENDENT_AMBULATORY_CARE_PROVIDER_SITE_OTHER): Payer: Medicaid Other | Admitting: *Deleted

## 2018-06-30 DIAGNOSIS — O24410 Gestational diabetes mellitus in pregnancy, diet controlled: Secondary | ICD-10-CM

## 2018-06-30 DIAGNOSIS — O0990 Supervision of high risk pregnancy, unspecified, unspecified trimester: Secondary | ICD-10-CM

## 2018-06-30 NOTE — Telephone Encounter (Addendum)
Called pt regarding the blood sugars she has logged in to babyscripts.  Advised pt that if her blood sugars are below 60, that she will need to eat a meal or, if unable to do that, will need to have a snack or drink some juice.  Pt verbalized understanding and then asked if that was all I was calling her regarding.  I advised the pt that, yes, I was just calling to ensure she understood what a low blood sugar was and how to counteract it.  Pt states that she was told she was going to be induced on the 5/16 because she wanted to have her baby on a weekend and did not want to wait until she got to 40 weeks.  Advised pt that based on her diagnosis of diet controlled GDM, induction usually occurs at 40 weeks which would be on the 5/21.  Advised pt that I would reach out to the provider but that he was not in this office location at this time and would call her when he responded.  Pt began yelling that "we did not know what we were doing and the provider told her."  Advised pt that, per chart review, there was not documentation of an induction.  Pt began yelling again stating that she talked with the provider and he told her and that she did not want to wait until she was 40 weeks this time.  Pt stated that she would sue [place] if she had to wait.  Advised pt again that I would reach out to the provider for clarification and would call her with his response.  Pt hung up the phone.

## 2018-06-30 NOTE — Progress Notes (Signed)
Nothing to do. Tell her if she gets below 60 for an am fasting to go ahead and eat breakfast then and there. If she can't eat breakfast then and there, then to do a snack or juice in the mean time. thanks

## 2018-06-30 NOTE — Progress Notes (Unsigned)
Received call from babyscripts regarding a low blood sugar.  Per chart review, pt saw Dr. Vergie Living on 06/27/18 and was told to start checking her fasting blood sugars.  Pt scheduled to see Dr. Marice Potter on 07/04/18.  Will route to provider.

## 2018-07-01 ENCOUNTER — Encounter (HOSPITAL_COMMUNITY): Payer: Self-pay | Admitting: *Deleted

## 2018-07-01 NOTE — Patient Instructions (Addendum)
Tawnie Barstow  07/01/2018   Your procedure is scheduled on:  07/05/2018  Arrive at 0700 at [employer] [facility] on [address] at [facility]  and [address]. You are invited to use the FREE valet parking or use the Visitor's parking deck.  Pick up the phone at the desk and dial [phone].  Call this number if you have problems the morning of surgery: [phone]  Remember:   Do not eat food:(After Midnight) Desps de medianoche.  Do not drink clear liquids: (After Midnight) Desps de medianoche.  Take these medicines the morning of surgery with A SIP OF WATER:  none   Do not wear jewelry, make-up or nail polish.  Do not wear lotions, powders, or perfumes. Do not wear deodorant.  Do not shave 48 hours prior to surgery.  Do not bring valuables to the hospital.  [facility] is not   responsible for any belongings or valuables brought to the hospital.  Contacts, dentures or bridgework may not be worn into surgery.  Leave suitcase in the car. After surgery it may be brought to your room.  For patients admitted to the hospital, checkout time is 11:00 AM the day of              discharge.      Please read over the following fact sheets that you were given:     Preparing for Surgery

## 2018-07-01 NOTE — Telephone Encounter (Signed)
Per chart review this morning, noted that pt has now been scheduled for a c-section on 5/16. Called pt and advised her of this appointment.  Apologized for the inconvenience of not being able to give her that information yesterday as it was not available.  Advised her that the provider's note as well as the patient herself indicated that she was having a vaginal delivery and the induction for that, this clinic would schedule.  But for a c-section, that is scheduled by someone outside of the office and that the provider sends a message to that person who schedules the appointment.  Pt verbalized understanding and expressed thanks at being informed of her appointment.

## 2018-07-02 ENCOUNTER — Ambulatory Visit (HOSPITAL_COMMUNITY)
Admission: RE | Admit: 2018-07-02 | Discharge: 2018-07-02 | Disposition: A | Discharge status: Home / Self Care | Payer: Medicaid Other | Source: Ambulatory Visit | Attending: Obstetrics and Gynecology | Admitting: Obstetrics and Gynecology

## 2018-07-02 ENCOUNTER — Other Ambulatory Visit: Payer: Self-pay

## 2018-07-02 DIAGNOSIS — Z1159 Encounter for screening for other viral diseases: Secondary | ICD-10-CM | POA: Insufficient documentation

## 2018-07-02 NOTE — MAU Note (Signed)
covid swab collected. Pt tolerated well. Pt asymptomatic 

## 2018-07-03 LAB — NOVEL CORONAVIRUS, NAA (HOSP ORDER, SEND-OUT TO REF LAB; TAT 18-24 HRS): SARS-CoV-2, NAA: NOT DETECTED

## 2018-07-04 ENCOUNTER — Telehealth (INDEPENDENT_AMBULATORY_CARE_PROVIDER_SITE_OTHER): Payer: Medicaid Other | Admitting: Obstetrics & Gynecology

## 2018-07-04 ENCOUNTER — Other Ambulatory Visit: Payer: Self-pay

## 2018-07-04 DIAGNOSIS — Z98891 History of uterine scar from previous surgery: Secondary | ICD-10-CM

## 2018-07-04 DIAGNOSIS — O99820 Streptococcus B carrier state complicating pregnancy: Secondary | ICD-10-CM | POA: Diagnosis not present

## 2018-07-04 DIAGNOSIS — O0993 Supervision of high risk pregnancy, unspecified, third trimester: Secondary | ICD-10-CM | POA: Diagnosis present

## 2018-07-04 DIAGNOSIS — O24410 Gestational diabetes mellitus in pregnancy, diet controlled: Secondary | ICD-10-CM | POA: Diagnosis not present

## 2018-07-04 DIAGNOSIS — O0990 Supervision of high risk pregnancy, unspecified, unspecified trimester: Secondary | ICD-10-CM

## 2018-07-04 NOTE — Progress Notes (Signed)
    TELEHEALTH VIRTUAL OBSTETRICS PRENATAL VISIT ENCOUNTER NOTE  I connected with Laurie Walsh on 07/04/18 at 10:55 AM EDT by WebEx at home and verified that I am speaking with the correct person using two identifiers.   I discussed the limitations, risks, security and privacy concerns of performing an evaluation and management service by telephone and the availability of in person appointments. I also discussed with the patient that there may be a patient responsible charge related to this service. The patient expressed understanding and agreed to proceed. Subjective:  Laurie Walsh is a 33 y.o. G2P1001 at [redacted]w[redacted]d being seen today for ongoing prenatal care.  She is currently monitored for the following issues for this high-risk pregnancy and has Supervision of high risk pregnancy, antepartum; H/O: C-section; GDM (gestational diabetes mellitus), class A1; and GBS (group B Streptococcus carrier), +RV culture, currently pregnant on their problem list.  Patient reports no complaints.  Reports fetal movement. Contractions: Not present. Vag. Bleeding: None.  Movement: Present. Denies any contractions, bleeding or leaking of fluid.   The following portions of the patient's history were reviewed and updated as appropriate: allergies, current medications, past family history, past medical history, past social history, past surgical history and problem list.   Objective:  There were no vitals filed for this visit.  Fetal Status:     Movement: Present     General:  Alert, oriented and cooperative. Patient is in no acute distress.  Respiratory: Normal respiratory effort, no problems with respiration noted  Mental Status: Normal mood and affect. Normal behavior. Normal judgment and thought content.  Rest of physical exam deferred due to type of encounter  Assessment and Plan:  Pregnancy: G2P1001 at [redacted]w[redacted]d 1. GBS (group B Streptococcus carrier), +RV culture, currently pregnant   2. GDM (gestational  diabetes mellitus), class A1 - she reports normal sugars on no meds  3. Supervision of high risk pregnancy, antepartum   4. H/O: C-section - for RLTCS tomorrow  Term labor symptoms and general obstetric precautions including but not limited to vaginal bleeding, contractions, leaking of fluid and fetal movement were reviewed in detail with the patient. I discussed the assessment and treatment plan with the patient. The patient was provided an opportunity to ask questions and all were answered. The patient agreed with the plan and demonstrated an understanding of the instructions. The patient was advised to call back or seek an in-person office evaluation/go to [facility] at [facility] for any urgent or concerning symptoms. Please refer to After Visit Summary for other counseling recommendations.   I provided 10 minutes of face-to-face via WebEx time during this encounter.  Return in about 2 weeks (around 07/18/2018) for incision check and 4 weeks for postpartum visit.  Future Appointments  Date Time Provider Department Center  07/11/2018  9:35 AM Willodean Rosenthal, MD [facility]    Allie Bossier, MD Center for [facility], [facility]

## 2018-07-05 ENCOUNTER — Encounter (HOSPITAL_COMMUNITY)
Admission: RE | Disposition: A | Discharge status: Home / Self Care | Payer: Self-pay | Source: Home / Self Care | Attending: Obstetrics and Gynecology

## 2018-07-05 ENCOUNTER — Inpatient Hospital Stay (HOSPITAL_COMMUNITY)
Admission: RE | Admit: 2018-07-05 | Discharge: 2018-07-07 | DRG: 788 | Disposition: A | Discharge status: Home / Self Care | Payer: Medicaid Other | Attending: Obstetrics and Gynecology | Admitting: Obstetrics and Gynecology

## 2018-07-05 ENCOUNTER — Encounter (HOSPITAL_COMMUNITY): Payer: Self-pay

## 2018-07-05 ENCOUNTER — Inpatient Hospital Stay (HOSPITAL_COMMUNITY): Payer: Medicaid Other | Admitting: Anesthesiology

## 2018-07-05 ENCOUNTER — Other Ambulatory Visit: Payer: Self-pay

## 2018-07-05 DIAGNOSIS — O9989 Other specified diseases and conditions complicating pregnancy, childbirth and the puerperium: Secondary | ICD-10-CM | POA: Diagnosis present

## 2018-07-05 DIAGNOSIS — O1410 Severe pre-eclampsia, unspecified trimester: Secondary | ICD-10-CM

## 2018-07-05 DIAGNOSIS — N736 Female pelvic peritoneal adhesions (postinfective): Secondary | ICD-10-CM | POA: Diagnosis present

## 2018-07-05 DIAGNOSIS — O1414 Severe pre-eclampsia complicating childbirth: Secondary | ICD-10-CM | POA: Diagnosis present

## 2018-07-05 DIAGNOSIS — O0990 Supervision of high risk pregnancy, unspecified, unspecified trimester: Secondary | ICD-10-CM

## 2018-07-05 DIAGNOSIS — O99214 Obesity complicating childbirth: Secondary | ICD-10-CM | POA: Diagnosis present

## 2018-07-05 DIAGNOSIS — Z98891 History of uterine scar from previous surgery: Secondary | ICD-10-CM

## 2018-07-05 DIAGNOSIS — Z3A39 39 weeks gestation of pregnancy: Secondary | ICD-10-CM

## 2018-07-05 DIAGNOSIS — O24410 Gestational diabetes mellitus in pregnancy, diet controlled: Secondary | ICD-10-CM | POA: Diagnosis present

## 2018-07-05 DIAGNOSIS — O99824 Streptococcus B carrier state complicating childbirth: Secondary | ICD-10-CM | POA: Diagnosis present

## 2018-07-05 DIAGNOSIS — K660 Peritoneal adhesions (postprocedural) (postinfection): Secondary | ICD-10-CM | POA: Diagnosis not present

## 2018-07-05 DIAGNOSIS — O34211 Maternal care for low transverse scar from previous cesarean delivery: Secondary | ICD-10-CM | POA: Diagnosis present

## 2018-07-05 DIAGNOSIS — Z1159 Encounter for screening for other viral diseases: Secondary | ICD-10-CM | POA: Diagnosis not present

## 2018-07-05 DIAGNOSIS — O99820 Streptococcus B carrier state complicating pregnancy: Secondary | ICD-10-CM

## 2018-07-05 DIAGNOSIS — O24420 Gestational diabetes mellitus in childbirth, diet controlled: Secondary | ICD-10-CM | POA: Diagnosis present

## 2018-07-05 DIAGNOSIS — O9962 Diseases of the digestive system complicating childbirth: Secondary | ICD-10-CM | POA: Diagnosis not present

## 2018-07-05 LAB — CBC
HCT: 37.1 % (ref 36.0–46.0)
HCT: 34.3 % — ABNORMAL LOW (ref 36.0–46.0)
Hemoglobin: 12.3 g/dL (ref 12.0–15.0)
Hemoglobin: 11.5 g/dL — ABNORMAL LOW (ref 12.0–15.0)
MCH: 30.7 pg (ref 26.0–34.0)
MCH: 30.9 pg (ref 26.0–34.0)
MCHC: 33.2 g/dL (ref 30.0–36.0)
MCHC: 33.5 g/dL (ref 30.0–36.0)
MCV: 92.5 fL (ref 80.0–100.0)
MCV: 92.2 fL (ref 80.0–100.0)
Platelets: 282 10*3/uL (ref 150–400)
Platelets: 263 K/uL (ref 150–400)
RBC: 4.01 MIL/uL (ref 3.87–5.11)
RBC: 3.72 MIL/uL — ABNORMAL LOW (ref 3.87–5.11)
RDW: 13.7 % (ref 11.5–15.5)
RDW: 13.8 % (ref 11.5–15.5)
WBC: 9.1 10*3/uL (ref 4.0–10.5)
WBC: 14.2 K/uL — ABNORMAL HIGH (ref 4.0–10.5)
nRBC: 0 % (ref 0.0–0.2)
nRBC: 0 % (ref 0.0–0.2)

## 2018-07-05 LAB — COMPREHENSIVE METABOLIC PANEL
ALT: 18 U/L (ref 0–44)
AST: 21 U/L (ref 15–41)
Albumin: 3 g/dL — ABNORMAL LOW (ref 3.5–5.0)
Alkaline Phosphatase: 215 U/L — ABNORMAL HIGH (ref 38–126)
Anion gap: 6 (ref 5–15)
BUN: 5 mg/dL — ABNORMAL LOW (ref 6–20)
CO2: 21 mmol/L — ABNORMAL LOW (ref 22–32)
Calcium: 9 mg/dL (ref 8.9–10.3)
Chloride: 110 mmol/L (ref 98–111)
Creatinine, Ser: 0.48 mg/dL (ref 0.44–1.00)
GFR calc Af Amer: >60 mL/min (ref >60)
GFR calc non Af Amer: >60 mL/min (ref >60)
Glucose, Bld: 82 mg/dL (ref 70–99)
Potassium: 3.9 mmol/L (ref 3.5–5.1)
Sodium: 137 mmol/L (ref 135–145)
Total Bilirubin: 0.6 mg/dL (ref 0.3–1.2)
Total Protein: 5.7 g/dL — ABNORMAL LOW (ref 6.5–8.1)

## 2018-07-05 LAB — CREATININE, SERUM
Creatinine, Ser: 0.4 mg/dL — ABNORMAL LOW (ref 0.44–1.00)
GFR calc Af Amer: >60 mL/min
GFR calc non Af Amer: >60 mL/min

## 2018-07-05 LAB — TYPE AND SCREEN
ABO/RH(D): O POS
Antibody Screen: NEGATIVE

## 2018-07-05 LAB — ABO/RH: ABO/RH(D): O POS

## 2018-07-05 LAB — GLUCOSE, CAPILLARY
Glucose-Capillary: 64 mg/dL — ABNORMAL LOW (ref 70–99)
Glucose-Capillary: 72 mg/dL (ref 70–99)
Glucose-Capillary: 72 mg/dL (ref 70–99)

## 2018-07-05 LAB — RPR: RPR Ser Ql: NONREACTIVE

## 2018-07-05 SURGERY — Surgical Case
Anesthesia: Spinal

## 2018-07-05 MED ORDER — HYDROMORPHONE HCL 1 MG/ML IJ SOLN: 1.0000 mg | INTRAMUSCULAR | Status: DC | PRN

## 2018-07-05 MED ORDER — SIMETHICONE 80 MG PO CHEW
80.0000 mg | CHEWABLE_TABLET | Freq: Three times a day (TID) | ORAL | Status: DC
  Administered 2018-07-05 – 2018-07-06 (×4): 80 mg via ORAL
  Filled 2018-07-05 (×5): qty 1

## 2018-07-05 MED ORDER — NALOXONE HCL 0.4 MG/ML IJ SOLN: 0.4000 mg | INTRAMUSCULAR | Status: DC | PRN

## 2018-07-05 MED ORDER — BUPIVACAINE IN DEXTROSE 0.75-8.25 % IT SOLN
INTRATHECAL | Status: DC | PRN
  Administered 2018-07-05: 1.6 mL via INTRATHECAL

## 2018-07-05 MED ORDER — FENTANYL CITRATE (PF) 100 MCG/2ML IJ SOLN
INTRAMUSCULAR | Status: DC | PRN
  Administered 2018-07-05: 15 ug via INTRATHECAL

## 2018-07-05 MED ORDER — ONDANSETRON HCL 4 MG/2ML IJ SOLN
INTRAMUSCULAR | Status: AC
  Filled 2018-07-05: qty 2

## 2018-07-05 MED ORDER — SCOPOLAMINE 1 MG/3DAYS TD PT72
MEDICATED_PATCH | TRANSDERMAL | Status: AC
  Filled 2018-07-05: qty 1

## 2018-07-05 MED ORDER — MORPHINE SULFATE (PF) 0.5 MG/ML IJ SOLN
INTRAMUSCULAR | Status: AC
  Filled 2018-07-05: qty 10

## 2018-07-05 MED ORDER — SIMETHICONE 80 MG PO CHEW: 80.0000 mg | CHEWABLE_TABLET | ORAL | Status: DC | PRN

## 2018-07-05 MED ORDER — ONDANSETRON HCL 4 MG/2ML IJ SOLN: 4.0000 mg | Freq: Once | INTRAMUSCULAR | Status: DC | PRN

## 2018-07-05 MED ORDER — NALOXONE HCL 4 MG/10ML IJ SOLN
1.0000 ug/kg/h | INTRAVENOUS | Status: DC | PRN
  Filled 2018-07-05: qty 5

## 2018-07-05 MED ORDER — MAGNESIUM SULFATE 2 GM/50ML IV SOLN
2.0000 g | Freq: Once | INTRAVENOUS | Status: DC
  Filled 2018-07-05: qty 50

## 2018-07-05 MED ORDER — MEPERIDINE HCL 25 MG/ML IJ SOLN: 6.2500 mg | INTRAMUSCULAR | Status: DC | PRN

## 2018-07-05 MED ORDER — DIPHENHYDRAMINE HCL 25 MG PO CAPS: 25.0000 mg | ORAL_CAPSULE | ORAL | Status: DC | PRN

## 2018-07-05 MED ORDER — COCONUT OIL OIL
1.0000 "application " | TOPICAL_OIL | Status: DC | PRN
  Administered 2018-07-06: 1 via TOPICAL

## 2018-07-05 MED ORDER — KETOROLAC TROMETHAMINE 30 MG/ML IJ SOLN: 30.0000 mg | Freq: Four times a day (QID) | INTRAMUSCULAR | Status: AC | PRN

## 2018-07-05 MED ORDER — BUPIVACAINE HCL (PF) 0.5 % IJ SOLN
INTRAMUSCULAR | Status: AC
  Filled 2018-07-05: qty 30

## 2018-07-05 MED ORDER — OXYTOCIN 40 UNITS IN NORMAL SALINE INFUSION - SIMPLE MED
INTRAVENOUS | Status: AC
  Filled 2018-07-05: qty 1000

## 2018-07-05 MED ORDER — NALBUPHINE HCL 10 MG/ML IJ SOLN: 5.0000 mg | INTRAMUSCULAR | Status: DC | PRN

## 2018-07-05 MED ORDER — MAGNESIUM SULFATE 40 G IN LACTATED RINGERS - SIMPLE
INTRAVENOUS | Status: AC
  Filled 2018-07-05: qty 500

## 2018-07-05 MED ORDER — SODIUM CHLORIDE 0.9% FLUSH: 3.0000 mL | INTRAVENOUS | Status: DC | PRN

## 2018-07-05 MED ORDER — SOD CITRATE-CITRIC ACID 500-334 MG/5ML PO SOLN
ORAL | Status: AC
  Filled 2018-07-05: qty 30

## 2018-07-05 MED ORDER — IBUPROFEN 800 MG PO TABS
800.0000 mg | ORAL_TABLET | Freq: Three times a day (TID) | ORAL | Status: DC
  Administered 2018-07-06 – 2018-07-07 (×4): 800 mg via ORAL
  Filled 2018-07-05 (×5): qty 1

## 2018-07-05 MED ORDER — ACETAMINOPHEN 325 MG PO TABS: 325.0000 mg | ORAL_TABLET | ORAL | Status: DC | PRN

## 2018-07-05 MED ORDER — SCOPOLAMINE 1 MG/3DAYS TD PT72
1.0000 | MEDICATED_PATCH | Freq: Once | TRANSDERMAL | Status: DC
  Administered 2018-07-05: 12:00:00 1.5 mg via TRANSDERMAL

## 2018-07-05 MED ORDER — FENTANYL CITRATE (PF) 100 MCG/2ML IJ SOLN: 25.0000 ug | INTRAMUSCULAR | Status: DC | PRN

## 2018-07-05 MED ORDER — WITCH HAZEL-GLYCERIN EX PADS: 1.0000 "application " | MEDICATED_PAD | CUTANEOUS | Status: DC | PRN

## 2018-07-05 MED ORDER — MAGNESIUM SULFATE 4 GM/100ML IV SOLN
4.0000 g | Freq: Once | INTRAVENOUS | Status: AC
  Administered 2018-07-05: 4 g via INTRAVENOUS
  Filled 2018-07-05: qty 100

## 2018-07-05 MED ORDER — SODIUM CHLORIDE 0.9 % IV SOLN
INTRAVENOUS | Status: DC | PRN
  Administered 2018-07-05: 10:00:00 via INTRAVENOUS

## 2018-07-05 MED ORDER — SIMETHICONE 80 MG PO CHEW
80.0000 mg | CHEWABLE_TABLET | ORAL | Status: DC
  Administered 2018-07-05: 80 mg via ORAL
  Filled 2018-07-05 (×2): qty 1

## 2018-07-05 MED ORDER — OXYTOCIN 40 UNITS IN NORMAL SALINE INFUSION - SIMPLE MED: 2.5000 [IU]/h | INTRAVENOUS | Status: AC

## 2018-07-05 MED ORDER — DIPHENHYDRAMINE HCL 25 MG PO CAPS
25.0000 mg | ORAL_CAPSULE | Freq: Four times a day (QID) | ORAL | Status: DC | PRN
  Administered 2018-07-05 – 2018-07-06 (×2): 25 mg via ORAL
  Filled 2018-07-05 (×2): qty 1

## 2018-07-05 MED ORDER — NALBUPHINE HCL 10 MG/ML IJ SOLN
5.0000 mg | Freq: Once | INTRAMUSCULAR | Status: AC | PRN
  Filled 2018-07-05: qty 1

## 2018-07-05 MED ORDER — ZOLPIDEM TARTRATE 5 MG PO TABS: 5.0000 mg | ORAL_TABLET | Freq: Every evening | ORAL | Status: DC | PRN

## 2018-07-05 MED ORDER — ENOXAPARIN SODIUM 40 MG/0.4ML ~~LOC~~ SOLN
40.0000 mg | SUBCUTANEOUS | Status: DC
  Administered 2018-07-06 – 2018-07-07 (×2): 40 mg via SUBCUTANEOUS
  Filled 2018-07-05 (×2): qty 0.4

## 2018-07-05 MED ORDER — LACTATED RINGERS IV SOLN
INTRAVENOUS | Status: DC
  Administered 2018-07-05 (×2): via INTRAVENOUS

## 2018-07-05 MED ORDER — NALBUPHINE HCL 10 MG/ML IJ SOLN
5.0000 mg | Freq: Once | INTRAMUSCULAR | Status: AC | PRN
  Administered 2018-07-05: 13:00:00 5 mg via SUBCUTANEOUS

## 2018-07-05 MED ORDER — MAGNESIUM SULFATE 40 G IN LACTATED RINGERS - SIMPLE
2.0000 g/h | INTRAVENOUS | Status: AC
  Administered 2018-07-06: 2 g/h via INTRAVENOUS

## 2018-07-05 MED ORDER — ACETAMINOPHEN 160 MG/5ML PO SOLN: 325.0000 mg | ORAL | Status: DC | PRN

## 2018-07-05 MED ORDER — SODIUM CHLORIDE 0.9 % IV SOLN
INTRAVENOUS | Status: DC | PRN
  Administered 2018-07-05: 40 [IU] via INTRAVENOUS

## 2018-07-05 MED ORDER — ONDANSETRON HCL 4 MG/2ML IJ SOLN
INTRAMUSCULAR | Status: DC | PRN
  Administered 2018-07-05: 4 mg via INTRAVENOUS

## 2018-07-05 MED ORDER — KETOROLAC TROMETHAMINE 30 MG/ML IJ SOLN
30.0000 mg | Freq: Four times a day (QID) | INTRAMUSCULAR | Status: AC
  Administered 2018-07-05 – 2018-07-06 (×3): 30 mg via INTRAVENOUS
  Filled 2018-07-05 (×3): qty 1

## 2018-07-05 MED ORDER — OXYCODONE HCL 5 MG/5ML PO SOLN: 5.0000 mg | Freq: Once | ORAL | Status: DC | PRN

## 2018-07-05 MED ORDER — LACTATED RINGERS IV SOLN
INTRAVENOUS | Status: DC
  Administered 2018-07-05 – 2018-07-06 (×2): via INTRAVENOUS

## 2018-07-05 MED ORDER — DIPHENHYDRAMINE HCL 50 MG/ML IJ SOLN
12.5000 mg | INTRAMUSCULAR | Status: DC | PRN
  Administered 2018-07-05: 17:00:00 12.5 mg via INTRAVENOUS
  Filled 2018-07-05: qty 1

## 2018-07-05 MED ORDER — PHENYLEPHRINE HCL-NACL 20-0.9 MG/250ML-% IV SOLN
INTRAVENOUS | Status: AC
  Filled 2018-07-05: qty 250

## 2018-07-05 MED ORDER — CEFAZOLIN SODIUM-DEXTROSE 2-4 GM/100ML-% IV SOLN
2.0000 g | INTRAVENOUS | Status: AC
  Administered 2018-07-05: 10:00:00 2 g via INTRAVENOUS

## 2018-07-05 MED ORDER — HYDROCODONE-ACETAMINOPHEN 5-325 MG PO TABS
2.0000 | ORAL_TABLET | Freq: Once | ORAL | Status: AC
  Administered 2018-07-05: 19:00:00 2 via ORAL
  Filled 2018-07-05: qty 2

## 2018-07-05 MED ORDER — PHENYLEPHRINE HCL-NACL 20-0.9 MG/250ML-% IV SOLN
INTRAVENOUS | Status: DC | PRN
  Administered 2018-07-05: 30 ug/min via INTRAVENOUS

## 2018-07-05 MED ORDER — EPHEDRINE SULFATE 50 MG/ML IJ SOLN
INTRAMUSCULAR | Status: DC | PRN
  Administered 2018-07-05: 10 mg via INTRAVENOUS

## 2018-07-05 MED ORDER — TETANUS-DIPHTH-ACELL PERTUSSIS 5-2.5-18.5 LF-MCG/0.5 IM SUSP: 0.5000 mL | Freq: Once | INTRAMUSCULAR | Status: DC

## 2018-07-05 MED ORDER — MORPHINE SULFATE (PF) 0.5 MG/ML IJ SOLN
INTRAMUSCULAR | Status: DC | PRN
  Administered 2018-07-05: .15 mg via INTRATHECAL

## 2018-07-05 MED ORDER — KETOROLAC TROMETHAMINE 30 MG/ML IJ SOLN
INTRAMUSCULAR | Status: DC | PRN
  Administered 2018-07-05: 30 mg via INTRAVENOUS

## 2018-07-05 MED ORDER — KETOROLAC TROMETHAMINE 30 MG/ML IJ SOLN
INTRAMUSCULAR | Status: AC
  Filled 2018-07-05: qty 1

## 2018-07-05 MED ORDER — DIBUCAINE (PERIANAL) 1 % EX OINT: 1.0000 "application " | TOPICAL_OINTMENT | CUTANEOUS | Status: DC | PRN

## 2018-07-05 MED ORDER — FENTANYL CITRATE (PF) 100 MCG/2ML IJ SOLN
INTRAMUSCULAR | Status: AC
  Filled 2018-07-05: qty 2

## 2018-07-05 MED ORDER — SOD CITRATE-CITRIC ACID 500-334 MG/5ML PO SOLN
30.0000 mL | ORAL | Status: AC
  Administered 2018-07-05: 30 mL via ORAL

## 2018-07-05 MED ORDER — BUPIVACAINE HCL (PF) 0.5 % IJ SOLN
INTRAMUSCULAR | Status: DC | PRN
  Administered 2018-07-05: 30 mL

## 2018-07-05 MED ORDER — SENNOSIDES-DOCUSATE SODIUM 8.6-50 MG PO TABS
2.0000 | ORAL_TABLET | ORAL | Status: DC
  Administered 2018-07-05 – 2018-07-06 (×2): 2 via ORAL
  Filled 2018-07-05 (×2): qty 2

## 2018-07-05 MED ORDER — MENTHOL 3 MG MT LOZG: 1.0000 | LOZENGE | OROMUCOSAL | Status: DC | PRN

## 2018-07-05 MED ORDER — OXYCODONE-ACETAMINOPHEN 5-325 MG PO TABS
2.0000 | ORAL_TABLET | ORAL | Status: DC | PRN
  Administered 2018-07-06 – 2018-07-07 (×3): 2 via ORAL
  Filled 2018-07-05 (×3): qty 2

## 2018-07-05 MED ORDER — PRENATAL MULTIVITAMIN CH
1.0000 | ORAL_TABLET | Freq: Every day | ORAL | Status: DC
  Administered 2018-07-06: 13:00:00 1 via ORAL
  Filled 2018-07-05: qty 1

## 2018-07-05 MED ORDER — OXYCODONE-ACETAMINOPHEN 5-325 MG PO TABS: 1.0000 | ORAL_TABLET | ORAL | Status: DC | PRN

## 2018-07-05 MED ORDER — OXYCODONE HCL 5 MG PO TABS: 5.0000 mg | ORAL_TABLET | Freq: Once | ORAL | Status: DC | PRN

## 2018-07-05 MED ORDER — ONDANSETRON HCL 4 MG/2ML IJ SOLN: 4.0000 mg | Freq: Three times a day (TID) | INTRAMUSCULAR | Status: DC | PRN

## 2018-07-05 MED ORDER — CEFAZOLIN SODIUM-DEXTROSE 2-4 GM/100ML-% IV SOLN
INTRAVENOUS | Status: AC
  Filled 2018-07-05: qty 100

## 2018-07-05 SURGICAL SUPPLY — 40 items
BENZOIN TINCTURE PRP APPL 2/3 (GAUZE/BANDAGES/DRESSINGS) ×3 IMPLANT
CHLORAPREP W/TINT 26ML (MISCELLANEOUS) ×3 IMPLANT
CLAMP CORD UMBIL (MISCELLANEOUS) IMPLANT
CLOSURE WOUND 1/2 X4 (GAUZE/BANDAGES/DRESSINGS) ×1
CLOTH BEACON ORANGE TIMEOUT ST (SAFETY) ×3 IMPLANT
DRAPE C SECTION CLR SCREEN (DRAPES) IMPLANT
DRSG OPSITE POSTOP 4X10 (GAUZE/BANDAGES/DRESSINGS) ×3 IMPLANT
ELECT REM PT RETURN 9FT ADLT (ELECTROSURGICAL) ×3
ELECTRODE REM PT RTRN 9FT ADLT (ELECTROSURGICAL) ×1 IMPLANT
EXTRACTOR VACUUM M CUP 4 TUBE (SUCTIONS) IMPLANT
EXTRACTOR VACUUM M CUP 4' TUBE (SUCTIONS)
GLOVE BIO SURGEON STRL SZ7.5 (GLOVE) ×3 IMPLANT
GLOVE BIOGEL PI IND STRL 7.0 (GLOVE) ×1 IMPLANT
GLOVE BIOGEL PI INDICATOR 7.0 (GLOVE) ×2
GOWN STRL REUS W/TWL 2XL LVL3 (GOWN DISPOSABLE) ×3 IMPLANT
GOWN STRL REUS W/TWL LRG LVL3 (GOWN DISPOSABLE) ×6 IMPLANT
HEMOSTAT ARISTA ABSORB 3G PWDR (HEMOSTASIS) ×3 IMPLANT
KIT ABG SYR 3ML LUER SLIP (SYRINGE) IMPLANT
NEEDLE HYPO 22GX1.5 SAFETY (NEEDLE) ×3 IMPLANT
NEEDLE HYPO 25X5/8 SAFETYGLIDE (NEEDLE) IMPLANT
NS IRRIG 1000ML POUR BTL (IV SOLUTION) ×3 IMPLANT
PACK C SECTION WH (CUSTOM PROCEDURE TRAY) ×3 IMPLANT
PAD OB MATERNITY 4.3X12.25 (PERSONAL CARE ITEMS) ×3 IMPLANT
PENCIL SMOKE EVAC W/HOLSTER (ELECTROSURGICAL) ×3 IMPLANT
RTRCTR C-SECT PINK 25CM LRG (MISCELLANEOUS) ×3 IMPLANT
STRIP CLOSURE SKIN 1/2X4 (GAUZE/BANDAGES/DRESSINGS) ×2 IMPLANT
SUT CHROMIC 1 CTX 36 (SUTURE) ×12 IMPLANT
SUT VIC AB 1 CT1 36 (SUTURE) ×6 IMPLANT
SUT VIC AB 2-0 CT1 (SUTURE) ×3 IMPLANT
SUT VIC AB 2-0 CT1 27 (SUTURE) ×2
SUT VIC AB 2-0 CT1 TAPERPNT 27 (SUTURE) ×1 IMPLANT
SUT VIC AB 3-0 CT1 27 (SUTURE) ×4
SUT VIC AB 3-0 CT1 TAPERPNT 27 (SUTURE) ×2 IMPLANT
SUT VIC AB 3-0 SH 27 (SUTURE)
SUT VIC AB 3-0 SH 27X BRD (SUTURE) IMPLANT
SUT VIC AB 4-0 KS 27 (SUTURE) ×3 IMPLANT
SYR BULB IRRIGATION 50ML (SYRINGE) IMPLANT
TOWEL OR 17X24 6PK STRL BLUE (TOWEL DISPOSABLE) ×3 IMPLANT
TRAY FOLEY W/BAG SLVR 14FR LF (SET/KITS/TRAYS/PACK) ×3 IMPLANT
WATER STERILE IRR 1000ML POUR (IV SOLUTION) ×3 IMPLANT

## 2018-07-05 NOTE — Op Note (Signed)
Cesarean Section Procedure Note  07/05/2018  10:53 AM  PATIENT:  Laurie Walsh  33 y.o. female  PRE-OPERATIVE DIAGNOSIS:  repeat cesarean section  POST-OPERATIVE DIAGNOSIS:  repeat cesarean section and pelvic adhesions  PROCEDURE:  Repeat cesarean section and lysis of adhesions  SURGEON:  Surgeon(s) and Role:    * Hermina Staggers, MD - Primary  ASSISTANTS: none   ANESTHESIA:   spinal and local  EBL:  Total I/O In: 1700 [I.V.:1700] Out: -   BLOOD ADMINISTERED:none  DRAINS: none   LOCAL MEDICATIONS USED:  MARCAINE     SPECIMEN:  No Specimen  DISPOSITION OF SPECIMEN:  L & D   Procedure Details   The patient was seen in the Holding Room. The risks, benefits, complications, treatment options, and expected outcomes were discussed with the patient.  The patient concurred with the proposed plan, giving informed consent.  The site of surgery properly noted/marked. The patient was taken to Operating Room # B, identified as Laurie Walsh and the procedure verified as C-Section Delivery. A Time Out was held and the above information confirmed.  After induction of anesthesia, the patient was draped and prepped in the usual sterile manner. A Pfannenstiel incision was made and carried down through the subcutaneous tissue to the fascia. Fascial incision was made and extended transversely. The fascia was separated from the underlying rectus tissue superiorly and inferiorly. The peritoneum was identified and entered. There were extensive adhesions involving the ant abd muscle and uterine wall. These were sharply and bluntly dissected away after about 10 mins. Alexis self retaining retractor was then placed.  The utero-vesical peritoneal reflection was identified but no bladder flap was created d/t adhesions.  A low transverse uterine incision was made. Delivered from cephalic presentation was a viable female infant. Agars and wt as noted.  After one minute, the umbilical cord was clamped and  cut cord blood was obtained for evaluation. The placenta was removed intact and appeared normal.  The uterine incision was closed with running locked sutures of Chromic x 2.  Hemostasis was observed. There was a large uterine to abd wall band note on the left side just anterior the round ligament. The band was crossed clamped x 2 cut and tie with a free tie of Chromic. Hemostasis was noted. Just midline to the band a uterine wall laceration was noted and closed with Chromic. Normal appearing bilateral tubes and ovaries were noted. All sites were reviewed and hemostasis was noted. Arestal was placed for add measure.  Lavage was carried out until clear. The abd muscle were reapproximated with 2/0 Vicryl. The fascia was then reapproximated with running sutures of Vicryl from conner to midline x 2.  The skin was reapproximated with Vicryl.  Instrument, sponge, and needle counts were correct prior the abdominal closure and at the conclusion of the case. Clear urine was noted in foley. Pt was started on Magnesium 4 gm bolus after cord clamp and noted to be hemodynamically stable by anesthesia.   Complications:  None; patient tolerated the procedure well.  COUNTS:  YES  PLAN OF CARE: Admit to inpatient   PATIENT DISPOSITION:  PACU - hemodynamically stable.   Delay start of Pharmacological VTE agent (>24hrs) due to surgical blood loss or risk of bleeding: not applicable             Disposition: PACU - hemodynamically stable.         Condition: stable   Hermina Staggers, MD 07/05/2018 10:53 AM

## 2018-07-05 NOTE — Progress Notes (Signed)
Pt. Sat and stood at side of bed. Pain level 7/10 post toradol. Anesthesia called for 1x dose of vicodin to help with breakthrough pain.  Pt. Had eaten dinner ([place]) w/o c/o n/v.  Tolerated OOB activity well. Will monitor.

## 2018-07-05 NOTE — Anesthesia Procedure Notes (Signed)
Spinal  Patient location during procedure: OR Start time: 07/05/2018 9:29 AM End time: 07/05/2018 9:35 AM Staffing Anesthesiologist: Bethena Midget, MD Preanesthetic Checklist Completed: patient identified, site marked, surgical consent, pre-op evaluation, timeout performed, IV checked, risks and benefits discussed and monitors and equipment checked Spinal Block Patient position: sitting Prep: DuraPrep Patient monitoring: heart rate, cardiac monitor, continuous pulse ox and blood pressure Approach: midline Location: L3-4 Injection technique: single-shot Needle Needle type: Sprotte  Needle gauge: 24 G Needle length: 9 cm Assessment Sensory level: T4

## 2018-07-05 NOTE — Transfer of Care (Signed)
Immediate Anesthesia Transfer of Care Note  Patient: Laurie Walsh  Procedure(s) Performed: CESAREAN SECTION (N/A )  Patient Location: PACU  Anesthesia Type:Spinal  Level of Consciousness: awake and alert   Airway & Oxygen Therapy: Patient Spontanous Breathing  Post-op Assessment: Report given to RN and Post -op Vital signs reviewed and stable  Post vital signs: Reviewed  Last Vitals:  Vitals Value Taken Time  BP 142/89 07/05/2018 11:00 AM  Temp    Pulse 89 07/05/2018 11:05 AM  Resp 16 07/05/2018 11:05 AM  SpO2 98 % 07/05/2018 11:05 AM  Vitals shown include unvalidated device data.  Last Pain:  Vitals:   07/05/18 1100  TempSrc: (P) Oral         Complications: No apparent anesthesia complications

## 2018-07-05 NOTE — H&P (Signed)
Laurie Walsh is a 33 y.o. female G2P1001 IUP 39 2/7 presenting for repeat c section. Pt reports + FM, denies VB, LOF or ut ctx. Denies Ha, visual changes or epigastric pain. Prenatal course complicated by prior c section and diet controlled GDM.  OB History    Gravida  2   Para  1   Term  1   Preterm  0   AB  0   Living  1     SAB  0   TAB  0   Ectopic  0   Multiple  0   Live Births  1          Past Medical History:  Diagnosis Date  . Gestational diabetes    Past Surgical History:  Procedure Laterality Date  . CESAREAN SECTION     Family History: family history includes Diabetes in her father. Social History:  reports that she has never smoked. She has never used smokeless tobacco. She reports that she does not drink alcohol or use drugs.     Maternal Diabetes: Yes:  Diabetes Type:  Diet controlled Genetic Screening: Normal Maternal Ultrasounds/Referrals: Normal Fetal Ultrasounds or other Referrals:  None Maternal Substance Abuse:  No Significant Maternal Medications:  None Significant Maternal Lab Results:  None Other Comments:  None  Review of Systems  Constitutional: Negative.   Respiratory: Negative.   Cardiovascular: Negative.   Gastrointestinal: Negative.   Genitourinary: Negative.    History   Blood pressure (!) 139/99, pulse 76, temperature 98.1 F (36.7 C), temperature source Oral, resp. rate 19, height 5\' 3"  (1.6 m), weight 91.6 kg, last menstrual period 10/05/2017, SpO2 99 %. Exam Physical Exam  Constitutional: She appears well-developed and well-nourished.  Neck: Normal range of motion. Neck supple.  Cardiovascular: Normal rate and regular rhythm.  Respiratory: Effort normal and breath sounds normal.  GI: Soft. Bowel sounds are normal.  Gravid S = D  Genitourinary:    Genitourinary Comments: deferred   Musculoskeletal: Normal range of motion.     Comments: Trace edema DTR's nl No clonus    Prenatal labs: ABO, Rh:  O/Positive/-- (12/19 0000) Antibody: Negative (12/19 0000) Rubella: Immune (12/19 0000) RPR: Nonreactive (03/09 0000)  HBsAg: Negative (12/19 0000)  HIV: Non-reactive (12/19 0000)  GBS:     Assessment/Plan: IUP 39 2/7 weeks SPEC by BP Prior c section, desire for repeat A1GDM  With new Dx of SPEC by BP, will start magnesium x 24 hrs and check additional labs. Plts normal. SPEC Dx and need for 245 hrs of magnesium discussed with pt.  COVID 19 testing negative. For repeat c section.  The risks of cesarean section discussed with the patient included but were not limited to: bleeding which may require transfusion or reoperation; infection which may require antibiotics; injury to bowel, bladder, ureters or other surrounding organs; injury to the fetus; need for additional procedures including hysterectomy in the event of a life-threatening hemorrhage; placental abnormalities wth subsequent pregnancies, incisional problems, thromboembolic phenomenon and other postoperative/anesthesia complications. The patient concurred with the proposed plan, giving informed written consent for the procedure.   Anesthesia and OR aware. Preoperative prophylactic antibiotics and SCDs ordered on call to the OR.  To OR when ready.  Haiden Clucas L. Alysia Penna, MD     Hermina Staggers 07/05/2018, 8:51 AM

## 2018-07-05 NOTE — Progress Notes (Signed)
Baby placed to breast every hour for 15 mins. Mother upset that "milk isn't there" Explained clostrum is what is available now, hand expression shown to MOB. Latch observed.  LEAD conversation with request of formula however mother's preference was to feed both breast and bottle.  Reviewed infant safety sheet with MOB and support person (MOB's brother) and discussed recording feeds/outputs and how much infant can tolerate PO at this point per the baby feeding sheet.   Discussed ordering meals, letting staff know of PO intake of fluids (Mag gtt).

## 2018-07-05 NOTE — Lactation Note (Signed)
This note was copied from a baby's chart. Lactation Consultation Note  Patient Name: Laurie Walsh [id number] Date: 07/05/2018 Reason for consult: Initial assessment;Term  47 hours old FT female who is now being partially BF and formula feed by his mother, she's a P2 and experienced BF. She was able to BF her first child for 4 months but mom is now worried that she doesn't have "any milk". Discussed lactogenesis II with mom and assisted with hand expression. Noticed that her nipples are inverted and her tissue is semi/non compressible but they evert upon stimulation. Unable to get colostrum at this point, mom had some areola edema but "very itchy" right now to even put on a bra and try some shells. LC set mom up with a hand pump, instructions, cleaning and storage were reviewed. Mom has a Hx of GDM but she reported (+) breast changes during the pregnancy.  Offered assistance with latch but mom politely declined, she said baby already had a bottle of Gerber formula. Asked mom to call for assistance when needed. Reviewed formula supplementation guidelines, feeding cues and normal newborn behavior.   Feeding plan:  1. Encouraged mom to feed baby STS 8-12 times/24 hours or sooner if feeding cues are present 2. Hand expression and pre-pumping were also encouraged along with spoon feeding 3. Mom will continue supplementing baby with [employer] B[employer] Gentle following formula supplementation guidelines volumes according to baby's age per hours. She' using a bottle with a slow flow nipple  BF brochure, BF resources and feeding diary were reviewed. Mom reported all questions and concerns were answered, she's aware of LC services and will call PRN.  Maternal Data Formula Feeding for Exclusion: Yes Reason for exclusion: Mother's choice to formula and breast feed on admission Has patient been taught Hand Expression?: Yes Does the patient have breastfeeding experience prior to this delivery?: Yes  Feeding Feeding  Type: Bottle Fed - Formula Nipple Type: Slow - flow   Interventions Interventions: Breast feeding basics reviewed;Hand express;Breast massage;Breast compression;Hand pump  Lactation Tools Discussed/Used Tools: Pump Breast pump type: Manual WIC Program: No Pump Review: Setup, frequency, and cleaning Initiated by:: MPeck Date initiated:: 07/05/18   Consult Status Consult Status: Follow-up Date: 07/06/18 Follow-up type: In-patient    Dawnell Bryant Venetia Constable 07/05/2018, 9:12 PM

## 2018-07-05 NOTE — Anesthesia Preprocedure Evaluation (Signed)
Anesthesia Evaluation  Patient identified by MRN, date of birth, ID band Patient awake    Reviewed: Allergy & Precautions, H&P , NPO status , Patient's Chart, lab work & pertinent test results, reviewed documented beta blocker date and time   Airway Mallampati: II  TM Distance: >3 FB Neck ROM: full    Dental no notable dental hx.    Pulmonary neg pulmonary ROS,    Pulmonary exam normal breath sounds clear to auscultation       Cardiovascular negative cardio ROS Normal cardiovascular exam Rhythm:regular Rate:Normal     Neuro/Psych negative neurological ROS  negative psych ROS   GI/Hepatic negative GI ROS, Neg liver ROS,   Endo/Other  diabetes, GestationalMorbid obesity  Renal/GU negative Renal ROS  negative genitourinary   Musculoskeletal   Abdominal   Peds  Hematology negative hematology ROS (+)   Anesthesia Other Findings   Reproductive/Obstetrics (+) Pregnancy                             Anesthesia Physical Anesthesia Plan  ASA: III and emergent  Anesthesia Plan: Spinal   Post-op Pain Management:    Induction:   PONV Risk Score and Plan: 2 and Treatment may vary due to age or medical condition and Ondansetron  Airway Management Planned: Natural Airway, Simple Face Mask and Nasal Cannula  Additional Equipment:   Intra-op Plan:   Post-operative Plan:   Informed Consent: I have reviewed the patients History and Physical, chart, labs and discussed the procedure including the risks, benefits and alternatives for the proposed anesthesia with the patient or authorized representative who has indicated his/her understanding and acceptance.       Plan Discussed with: Anesthesiologist, Surgeon and CRNA  Anesthesia Plan Comments: (  )        Anesthesia Quick Evaluation

## 2018-07-05 NOTE — Anesthesia Postprocedure Evaluation (Signed)
Anesthesia Post Note  Patient: Talayia Schnapp  Procedure(s) Performed: CESAREAN SECTION (N/A )     Patient location during evaluation: PACU Anesthesia Type: Spinal Level of consciousness: oriented and awake and alert Pain management: pain level controlled Vital Signs Assessment: post-procedure vital signs reviewed and stable Respiratory status: spontaneous breathing, respiratory function stable and patient connected to nasal cannula oxygen Cardiovascular status: blood pressure returned to baseline and stable Postop Assessment: no headache, no backache and no apparent nausea or vomiting Anesthetic complications: no    Last Vitals:  Vitals:   07/05/18 1130 07/05/18 1145  BP: (!) 139/94 136/90  Pulse: 80 94  Resp: 13 (!) 23  Temp:    SpO2: 99% 100%    Last Pain:  Vitals:   07/05/18 1145  TempSrc:   PainSc: 4    Pain Goal:                Epidural/Spinal Function Cutaneous sensation: Able to Wiggle Toes (07/05/18 1145), Patient able to flex knees: No (07/05/18 1145), Patient able to lift hips off bed: No (07/05/18 1145), Back pain beyond tenderness at insertion site: No (07/05/18 1145), Progressively worsening motor and/or sensory loss: No (07/05/18 1145), Bowel and/or bladder incontinence post epidural: No (07/05/18 1145)  Wilfred Siverson

## 2018-07-06 LAB — CBC
HCT: 30.2 % — ABNORMAL LOW (ref 36.0–46.0)
Hemoglobin: 9.9 g/dL — ABNORMAL LOW (ref 12.0–15.0)
MCH: 30.7 pg (ref 26.0–34.0)
MCHC: 32.8 g/dL (ref 30.0–36.0)
MCV: 93.5 fL (ref 80.0–100.0)
Platelets: 232 10*3/uL (ref 150–400)
RBC: 3.23 MIL/uL — ABNORMAL LOW (ref 3.87–5.11)
RDW: 13.9 % (ref 11.5–15.5)
WBC: 9.4 10*3/uL (ref 4.0–10.5)
nRBC: 0 % (ref 0.0–0.2)

## 2018-07-06 NOTE — Lactation Note (Signed)
This note was copied from a baby's chart. Lactation Consultation Note  Patient Name: Laurie Walsh [id number] Date: 07/06/2018 Reason for consult: Follow-up assessment;Difficult latch;Term Type of Endocrine Disorder?: Diabetes  P2 mother whose infant is now 30 hours old.  Mother breast fed her first child for 4 months.  Mother is concerned that she does not "have any milk."  Explained milk coming to volume and  hand expression to help increase milk supply.  Mother continued to be "concerned" and wants reassurance that baby is "getting milk."  Baby has not been very interested in learning to latch.  Educated her on baby's age, tummy size and feeding supplementation guidelines.  She has been formula feeding 20-30 mls when baby was <24 hours old.  Encouraged mother to feed less formula so baby awakens better for latching.  Offered to initiate the DEBP to help alleviate mother's concerns.  Mother accepted.  Pump parts, assembly, disassembly and cleaning reviewed.  Showed mother how to determine appropriate suction setting for pump.  #24 flange size is appropriate at this time; she may require a #27 soon if she experiences pain with pump.  At this time mother denies pain.  Encouraged mother to observe for feeding cues and to put baby to breast prior to giving any formula supplementation.  She will pump after breast feeding and feed back any EBM she obtains to baby.  Finger feeding demonstrated.  Mother will call her RN/LC for assistance as needed.     Maternal Data Formula Feeding for Exclusion: Yes Reason for exclusion: Mother's choice to formula and breast feed on admission Has patient been taught Hand Expression?: Yes Does the patient have breastfeeding experience prior to this delivery?: Yes  Feeding Feeding Type: Breast Fed Nipple Type: Slow - flow  LATCH Score Latch: Too sleepy or reluctant, no latch achieved, no sucking elicited.  Audible Swallowing: None  Type of Nipple: Everted at  rest and after stimulation(short shafted)  Comfort (Breast/Nipple): Soft / non-tender  Hold (Positioning): Assistance needed to correctly position infant at breast and maintain latch.  LATCH Score: 5  Interventions Interventions: Breast feeding basics reviewed;Assisted with latch;Skin to skin;Hand express;Hand pump;Position options;Support pillows;Adjust position;DEBP  Lactation Tools Discussed/Used WIC Program: No Pump Review: Setup, frequency, and cleaning;Milk Storage Initiated by:: Jermario Kalmar Date initiated:: 07/06/18   Consult Status Consult Status: Follow-up Date: 07/07/18 Follow-up type: In-patient    Casimir Barcellos R Leeza Heiner 07/06/2018, 9:34 AM

## 2018-07-06 NOTE — Progress Notes (Signed)
Subjective: Postpartum Day 1: Cesarean Delivery/ [facility] Patient reports feeling a little tried. Denies HA or visual changes. Toleratring diet. Voiding. Pain controlled. Bottle feeding  Objective: Vital signs in last 24 hours: Temp:  [97.5 F (36.4 C)-98.4 F (36.9 C)] 97.9 F (36.6 C) (05/17 0530) Pulse Rate:  [72-110] 72 (05/17 0530) Resp:  [13-23] 18 (05/17 0655) BP: (110-150)/(60-104) 111/71 (05/17 0530) SpO2:  [94 %-100 %] 100 % (05/17 0530) Weight:  [91.6 kg] 91.6 kg (05/16 0752)  Physical Exam:  General: alert Lochia: appropriate Uterine Fundus: firm Incision: healing well DVT Evaluation: No evidence of DVT seen on physical exam.  Recent Labs    07/05/18 1430 07/06/18 0620  HGB 11.5* 9.9*  HCT 34.3* 30.2*    Assessment/Plan: POD # 1 RLTCS SPEC  BP stable without meds. Magnesium off this morning. Will follow BP's. Continue with progressive care  Hermina Staggers 07/06/2018, 7:20 AM

## 2018-07-06 NOTE — Progress Notes (Addendum)
CSW received consult due to score 15 on Edinburgh Depression Screen.   When CSW arrived, MOB was resting on the couch in engaging in skin to skin with infant.  MOB was polite and receptive to meeting with CSW.   CSW reviewed MOB's Edinburgh responses and processed MOB's score.  MOB communicated, "I don't feel sad or overwhelmed.  Yesterday I was very sick and I just marked anything on the sheet."    CSW provided education regarding Baby Blues vs PMADs and provided MOB with resources for mental health follow up.  CSW encouraged MOB to evaluate her mental health throughout the postpartum period and notify a medical professional if symptoms arise; MOB agreed.  CSW assessed for safety and MOB denied SI, HI, and DV.    MOB asked about Medicaid application and CSW explain process for infant.  MOB also called a relative that suggest that infant receives follow-up care with [facility].  CSW agreed to update medical team. CSW also encouraged MOB to [facility] to assist her with establishing care her older daughter.    Stefen Juba Boyd-Gilyard, MSW, LCSW Clinical Social Work [phone] 

## 2018-07-07 DIAGNOSIS — O1410 Severe pre-eclampsia, unspecified trimester: Secondary | ICD-10-CM

## 2018-07-07 MED ORDER — OXYCODONE-ACETAMINOPHEN 5-325 MG PO TABS: 1.0000 | ORAL_TABLET | Freq: Four times a day (QID) | ORAL | 0 refills | Status: DC | PRN

## 2018-07-07 MED ORDER — IBUPROFEN 800 MG PO TABS: 800.0000 mg | ORAL_TABLET | Freq: Three times a day (TID) | ORAL | 1 refills | Status: DC | PRN

## 2018-07-07 NOTE — Lactation Note (Signed)
This note was copied from a baby's chart. Lactation Consultation Note  Patient Name: Laurie Walsh [id number] Date: 07/07/2018 Reason for consult: Follow-up assessment;Difficult latch;Term  P2 mother whose infant is now 61 hours old.  Mother breast fed her first child for 4 months.  Mother continues to be concerned that she "does not have any milk"  She has not put baby to breast consistently and has given formula.  Educated mother again about the importance of latching baby to breast every time he shows feeding cues and only using the formula as supplementation.  Reviewed topics I discussed yesterday with her.  She asked where she can purchase formula.  She also had many questions regarding WIC (currently does not have), Medicaid and baby's circumcision which she wants to pay for today and have completed here in the hospital.  I was able to answer her [facility] questions but referred her to her RN for the other topics.  Encouraged to continue feeding 8-12 times/24 hours or sooner if baby shows feeding cues.  She has the feeding supplementation guideline sheet to use at home.    Baby began showing cues and I offered to assist with latching.  Mother accepted.  Assisted baby to latch in the cross cradle position.  Mother initially felt some pain and chin tug performed with relief.  Made suggestions as to how she can create a better hold, finger and hand positioning and body position for herself.  Mother continued to compress breast tissue near baby's nose after this discussion.  She also changed from cross cradle to cradle position.  I explained why cross cradle is preferred but she wanted to continue in the cradle hold.    Engorgement prevention/treatment discussed.  Mother has a manual pump for home use.  She is not interested in obtaining a DEBP at this time.  She has our OP phone number for questions after discharge.     Maternal Data Formula Feeding for Exclusion: Yes Reason for exclusion:  Mother's choice to formula and breast feed on admission  Feeding Feeding Type: Breast Fed  LATCH Score Latch: Grasps breast easily, tongue down, lips flanged, rhythmical sucking.  Audible Swallowing: A few with stimulation  Type of Nipple: Everted at rest and after stimulation  Comfort (Breast/Nipple): Filling, red/small blisters or bruises, mild/mod discomfort  Hold (Positioning): Assistance needed to correctly position infant at breast and maintain latch.  LATCH Score: 7  Interventions Interventions: Breast feeding basics reviewed;Assisted with latch;Skin to skin;Breast massage;Breast compression;Position options;Support pillows;Adjust position;DEBP  Lactation Tools Discussed/Used WIC Program: No   Consult Status Consult Status: Complete Date: 07/07/18 Follow-up type: Call as needed    [name] 07/07/2018, 10:21 AM

## 2018-07-07 NOTE — Discharge Instructions (Signed)

## 2018-07-07 NOTE — Progress Notes (Signed)
Pt discharged with printed instructions. Pt verbalized an understanding. No concerns noted. Rayyan Burley L Haddon Fyfe, RN 

## 2018-07-07 NOTE — Discharge Summary (Signed)
Physician Discharge Summary  Patient ID: Laurie Walsh MRN: [id number] DOB/AGE: 04/02/1985 33 y.o.  Admit date: 07/05/2018 Discharge date: 07/07/2018  Discharge Diagnoses:  Active Problems:   Supervision of high risk pregnancy, antepartum   H/O: C-section   GDM (gestational diabetes mellitus), class A1   GBS (group B Streptococcus carrier), +RV culture, currently pregnant   Status post cesarean section   Severe preeclampsia   Reason for Admission: cesarean section, subsequently found to have severe preeclampsia Prenatal Procedures: NST and ultrasound Intrapartum Procedures: repeat low transverse c-section Postpartum Procedures: none Complications-Operative and Postpartum: none    Hospital Course: Please see HPI dated 07/05/2018 for details. This is a 33 y.o. G2P2002 now POD#2 from a scheduled repeat c-section, found to have severe preeclampsia concurrently. Her antepartum course was uncomplicated. Her post partum course was uncomplicated and she received 24 hrs magnesium prophylaxis. BP well controlled on no medications. By day 2, she was ambulating, passing flatus and pain was well controlled. She was discharged home POD#2 in good condition. Baby is doing well and will be discharged home with patient.   Instructions for follow up given, she will follow up 4 days for BP check, 2 weeks for incision check and 1 month for post partum visit.   Physical exam  Vitals:   07/06/18 1656 07/06/18 1945 07/07/18 0047 07/07/18 0455  BP: 122/68 140/76 129/78 119/60  Pulse: 85 83 94 94  Resp: 18  18 18   Temp: 98.2 F (36.8 C) 98 F (36.7 C) 98.3 F (36.8 C) 98 F (36.7 C)  TempSrc: Oral Oral Oral Oral  SpO2: 100%   98%  Weight:      Height:       Physical Exam:  General: alert, oriented, cooperative Chest:  normal respiratory effort Heart: RRR  Abdomen:  soft, appropriately tender to palpation, incision clean/dry/intact Uterine Fundus: firm, 2 fingers below the umbilicus  Lochia: moderate, rubra DVT Evaluation: no evidence of DVT Extremities: no edema, no calf tenderness   Postpartum contraception: IUD  Disposition: home  Discharged Condition: good  Discharge Instructions    Ambulatory referral to Lactation   Complete by:  As directed    Reason for consult:  The Mother-Infant Dyad Needs Assistance in the Continuation of Breastfeeding   Call MD for:  difficulty breathing, headache or visual disturbances   Complete by:  As directed    Call MD for:  extreme fatigue   Complete by:  As directed    Call MD for:  hives   Complete by:  As directed    Call MD for:  persistant dizziness or light-headedness   Complete by:  As directed    Call MD for:  persistant nausea and vomiting   Complete by:  As directed    Call MD for:  redness, tenderness, or signs of infection (pain, swelling, redness, odor or green/yellow discharge around incision site)   Complete by:  As directed    Call MD for:  severe uncontrolled pain   Complete by:  As directed    Call MD for:  temperature >100.4   Complete by:  As directed    Diet - low sodium heart healthy   Complete by:  As directed      Allergies as of 07/07/2018   No Known Allergies     Medication List    TAKE these medications   ibuprofen 800 MG tablet Commonly known as:  ADVIL Take 1 tablet (800 mg total) by mouth every 8 (eight)  hours as needed.   oxyCODONE-acetaminophen 5-325 MG tablet Commonly known as:  PERCOCET/ROXICET Take 1 tablet by mouth every 6 (six) hours as needed for moderate pain.   PRENATAL PO Take 1 tablet by mouth daily. What changed:  Another medication with the same name was removed. Continue taking this medication, and follow the directions you see here.      Follow-up Information    [facility] for [facility]. Schedule an appointment as soon as possible for a visit in 2 week(s).   Specialty:  Obstetrics and Gynecology Why:  for incision check, 4 weeks for post  partum visit Contact information: [address], [address] [id number] mc [place] [address] [phone]           Signed: Conan Bowens 07/07/2018, 7:50 AM

## 2018-07-11 ENCOUNTER — Other Ambulatory Visit: Payer: Self-pay

## 2018-07-11 ENCOUNTER — Encounter: Payer: Self-pay | Admitting: Obstetrics & Gynecology

## 2018-07-11 ENCOUNTER — Encounter: Payer: Medicaid Other | Admitting: Obstetrics & Gynecology

## 2018-07-21 ENCOUNTER — Encounter (HOSPITAL_COMMUNITY): Payer: Self-pay | Admitting: Nurse Practitioner

## 2018-07-21 ENCOUNTER — Other Ambulatory Visit: Payer: Self-pay

## 2018-07-21 ENCOUNTER — Ambulatory Visit (INDEPENDENT_AMBULATORY_CARE_PROVIDER_SITE_OTHER): Payer: Medicaid Other | Admitting: General Practice

## 2018-07-21 ENCOUNTER — Telehealth: Payer: Self-pay | Admitting: Obstetrics and Gynecology

## 2018-07-21 ENCOUNTER — Inpatient Hospital Stay (HOSPITAL_COMMUNITY)
Admission: AD | Admit: 2018-07-21 | Discharge: 2018-07-21 | Disposition: A | Discharge status: Home / Self Care | Payer: Medicaid Other | Attending: Obstetrics & Gynecology | Admitting: Obstetrics & Gynecology

## 2018-07-21 VITALS — BP 156/88 | HR 64

## 2018-07-21 DIAGNOSIS — O34219 Maternal care for unspecified type scar from previous cesarean delivery: Secondary | ICD-10-CM | POA: Diagnosis not present

## 2018-07-21 DIAGNOSIS — O24439 Gestational diabetes mellitus in the puerperium, unspecified control: Secondary | ICD-10-CM | POA: Diagnosis not present

## 2018-07-21 DIAGNOSIS — O1415 Severe pre-eclampsia, complicating the puerperium: Secondary | ICD-10-CM | POA: Insufficient documentation

## 2018-07-21 DIAGNOSIS — Z833 Family history of diabetes mellitus: Secondary | ICD-10-CM | POA: Diagnosis not present

## 2018-07-21 DIAGNOSIS — Z0130 Encounter for examination of blood pressure without abnormal findings: Secondary | ICD-10-CM

## 2018-07-21 DIAGNOSIS — Z5189 Encounter for other specified aftercare: Secondary | ICD-10-CM

## 2018-07-21 DIAGNOSIS — O1414 Severe pre-eclampsia complicating childbirth: Secondary | ICD-10-CM | POA: Diagnosis not present

## 2018-07-21 LAB — CBC
HCT: 32.8 % — ABNORMAL LOW (ref 36.0–46.0)
Hemoglobin: 10.6 g/dL — ABNORMAL LOW (ref 12.0–15.0)
MCH: 30.2 pg (ref 26.0–34.0)
MCHC: 32.3 g/dL (ref 30.0–36.0)
MCV: 93.4 fL (ref 80.0–100.0)
Platelets: 414 10*3/uL — ABNORMAL HIGH (ref 150–400)
RBC: 3.51 MIL/uL — ABNORMAL LOW (ref 3.87–5.11)
RDW: 14 % (ref 11.5–15.5)
WBC: 5.5 10*3/uL (ref 4.0–10.5)
nRBC: 0 % (ref 0.0–0.2)

## 2018-07-21 LAB — COMPREHENSIVE METABOLIC PANEL
ALT: 23 U/L (ref 0–44)
AST: 20 U/L (ref 15–41)
Albumin: 3.4 g/dL — ABNORMAL LOW (ref 3.5–5.0)
Alkaline Phosphatase: 101 U/L (ref 38–126)
Anion gap: 7 (ref 5–15)
BUN: 14 mg/dL (ref 6–20)
CO2: 25 mmol/L (ref 22–32)
Calcium: 9.1 mg/dL (ref 8.9–10.3)
Chloride: 108 mmol/L (ref 98–111)
Creatinine, Ser: 0.74 mg/dL (ref 0.44–1.00)
GFR calc Af Amer: >60 mL/min (ref >60)
GFR calc non Af Amer: >60 mL/min (ref >60)
Glucose, Bld: 96 mg/dL (ref 70–99)
Potassium: 3.9 mmol/L (ref 3.5–5.1)
Sodium: 140 mmol/L (ref 135–145)
Total Bilirubin: 0.5 mg/dL (ref 0.3–1.2)
Total Protein: 6.3 g/dL — ABNORMAL LOW (ref 6.5–8.1)

## 2018-07-21 MED ORDER — NIFEDIPINE 10 MG PO CAPS
10.0000 mg | ORAL_CAPSULE | ORAL | Status: DC | PRN
  Administered 2018-07-21: 10 mg via ORAL
  Filled 2018-07-21: qty 1

## 2018-07-21 MED ORDER — NIFEDIPINE 10 MG PO CAPS
20.0000 mg | ORAL_CAPSULE | ORAL | Status: DC | PRN
  Filled 2018-07-21: qty 2

## 2018-07-21 MED ORDER — NIFEDIPINE ER 30 MG PO TB24: 30.0000 mg | ORAL_TABLET | Freq: Every day | ORAL | 1 refills | Status: DC

## 2018-07-21 MED ORDER — LABETALOL HCL 5 MG/ML IV SOLN: 40.0000 mg | INTRAVENOUS | Status: DC | PRN

## 2018-07-21 MED ORDER — NIFEDIPINE 10 MG PO CAPS
20.0000 mg | ORAL_CAPSULE | ORAL | Status: DC | PRN
  Administered 2018-07-21: 20 mg via ORAL

## 2018-07-21 MED ORDER — NIFEDIPINE ER OSMOTIC RELEASE 30 MG PO TB24
30.0000 mg | ORAL_TABLET | Freq: Every day | ORAL | Status: DC
  Administered 2018-07-21: 30 mg via ORAL
  Filled 2018-07-21: qty 1

## 2018-07-21 NOTE — MAU Note (Addendum)
Pt had c-section on 07/05/2018. She reports HBP of 160/? And another of 150/?, pt does not remember what the specific numbers were. She was on the phone this afternoon with a nurse doing a 2 wk post-partum check up and she was advised to call her doctor after the two elevated BPs and they advised her to come here. 1-2/10 HA, ibuprofen was helpful and took it last around 3 pm. Slight bilateral feet swelling after the c-section, no hand swelling, denies blurred vision or epigastric pain. Denies vaginal discharge. Pt having a light period.

## 2018-07-21 NOTE — Telephone Encounter (Signed)
Spoke with patient about coming in for a BP check per the provider request. Patient stated she has a BP cuff, and no transportation to come in the office. She also stated she was not able to get her MyChart working. Stated she tried, and she had someone from [facility] try and it still won't work. She request her visits be done via Webex.

## 2018-07-21 NOTE — Progress Notes (Signed)
Webex video visit completed today for BP check and wound check. Patient had repeat c-section on 5/16 and developed severe pre-e. Patient was not sent home on any medications. Patient reports frequent headaches but denies dizziness or blurry vision. Patient states she is not sleeping at night very well because the baby keeps her up. Patient also reports still having some pain near navel- states she is having regular bowel movements and is not constipated. Asked if ibuprofen helped pain and she states yes. Discussed with patient she is still healing from surgery and it may take a while to recover. Told patient to continue ibuprofen and to make sure she isn't doing too much. Patient verbalized understanding. When asked to see incision, patient still had honeycomb dressing in place. Asked patient to remove dressing which she did. Steri strips are in place with a few loose strips, instructed patient to remove those as well. Incision appears to be clean, dry & intact and well approximated. Wound care reviewed with patient as well as signs & symptoms of infection. Patient verbalized understanding. Asked patient to check BP- 164/85 and 156/88- Discussed with Dr Vergie Living who states patient should return to MAU for pre-e evaluation. Informed patient of recommendation to go to MAU for further evaluation. Discussed importance of going as soon as able. Patient verbalized understanding to all & had no questions.  Chase Caller RN BSN 07/21/18

## 2018-07-21 NOTE — Discharge Instructions (Signed)
Postpartum Hypertension  Postpartum hypertension is high blood pressure that remains higher than normal after childbirth. You may not realize that you have postpartum hypertension if your blood pressure is not being checked regularly. In most cases, postpartum hypertension will go away on its own, usually within a week of delivery. However, for some women, medical treatment is required to prevent serious complications, such as seizures or stroke.  What are the causes?  This condition may be caused by one or more of the following:   Hypertension that existed before pregnancy (chronic hypertension).   Hypertension that comes on as a result of pregnancy (gestational hypertension).   Hypertensive disorders during pregnancy (preeclampsia) or seizures in women who have high blood pressure during pregnancy (eclampsia).   A condition in which the liver, platelets, and red blood cells are damaged during pregnancy (HELLP syndrome).   A condition in which the thyroid produces too much hormones (hyperthyroidism).   Other rare problems of the nerves (neurological disorders) or blood disorders.  In some cases, the cause may not be known.  What increases the risk?  The following factors may make you more likely to develop this condition:   Chronic hypertension. In some cases, this may not have been diagnosed before pregnancy.   Obesity.   Type 2 diabetes.   Kidney disease.   History of preeclampsia or eclampsia.   Other medical conditions that change the level of hormones in the body (hormonal imbalance).  What are the signs or symptoms?  As with all types of hypertension, postpartum hypertension may not have any symptoms. Depending on how high your blood pressure is, you may experience:   Headaches. These may be mild, moderate, or severe. They may also be steady, constant, or sudden in onset (thunderclap headache).   Changes in your ability to see (visual changes).   Dizziness.   Shortness of breath.   Swelling  of your hands, feet, lower legs, or face. In some cases, you may have swelling in more than one of these locations.   Heart palpitations or a racing heartbeat.   Difficulty breathing while lying down.   Decrease in the amount of urine that you pass.  Other rare signs and symptoms may include:   Sweating more than usual. This lasts longer than a few days after delivery.   Chest pain.   Sudden dizziness when you get up from sitting or lying down.   Seizures.   Nausea or vomiting.   Abdominal pain.  How is this diagnosed?  This condition may be diagnosed based on the results of a physical exam, blood pressure measurements, and blood and urine tests.  You may also have other tests, such as a CT scan or an MRI, to check for other problems of postpartum hypertension.  How is this treated?  If blood pressure is high enough to require treatment, your options may include:   Medicines to reduce blood pressure (antihypertensives). Tell your health care provider if you are breastfeeding or if you plan to breastfeed. There are many antihypertensive medicines that are safe to take while breastfeeding.   Stopping medicines that may be causing hypertension.   Treating medical conditions that are causing hypertension.   Treating the complications of hypertension, such as seizures, stroke, or kidney problems.  Your health care provider will also continue to monitor your blood pressure closely until it is within a safe range for you.  Follow these instructions at home:   Take over-the-counter and prescription medicines only as   told by your health care provider.   Return to your normal activities as told by your health care provider. Ask your health care provider what activities are safe for you.   Do not use any products that contain nicotine or tobacco, such as cigarettes and e-cigarettes. If you need help quitting, ask your health care provider.   Keep all follow-up visits as told by your health care provider. This  is important.  Contact a health care provider if:   Your symptoms get worse.   You have new symptoms, such as:  ? A headache that does not get better.  ? Dizziness.  ? Visual changes.  Get help right away if:   You suddenly develop swelling in your hands, ankles, or face.   You have sudden, rapid weight gain.   You develop difficulty breathing, chest pain, racing heartbeat, or heart palpitations.   You develop severe pain in your abdomen.   You have any symptoms of a stroke. "BE FAST" is an easy way to remember the main warning signs of a stroke:  ? B - Balance. Signs are dizziness, sudden trouble walking, or loss of balance.  ? E - Eyes. Signs are trouble seeing or a sudden change in vision.  ? F - Face. Signs are sudden weakness or numbness of the face, or the face or eyelid drooping on one side.  ? A - Arms. Signs are weakness or numbness in an arm. This happens suddenly and usually on one side of the body.  ? S - Speech. Signs are sudden trouble speaking, slurred speech, or trouble understanding what people say.  ? T - Time. Time to call emergency services. Write down what time symptoms started.   You have other signs of a stroke, such as:  ? A sudden, severe headache with no known cause.  ? Nausea or vomiting.  ? Seizure.  These symptoms may represent a serious problem that is an emergency. Do not wait to see if the symptoms will go away. Get medical help right away. Call your local emergency services (911 in the [place] Do not drive yourself to the hospital.  Summary   Postpartum hypertension is high blood pressure that remains higher than normal after childbirth.   In most cases, postpartum hypertension will go away on its own, usually within a week of delivery.   For some women, medical treatment is required to prevent serious complications, such as seizures or stroke.  This information is not intended to replace advice given to you by your health care provider. Make sure you discuss any questions  you have with your health care provider.  Document Released: 10/09/2013 Document Revised: 11/26/2016 Document Reviewed: 11/26/2016  Elsevier Interactive Patient Education  2019 [employer] Inc.

## 2018-07-21 NOTE — MAU Note (Signed)
Laurie Walsh, CNM okayed pt to leave after Procardia 30 mg XL because pt had multiple doses of procardia this encounter with no issues (not having to wait 20 min). D/c paper work and last VS taken and pt was d/c'd stable approx. 10 min. Later.

## 2018-07-21 NOTE — MAU Provider Note (Signed)
History     CSN: [id number]  Arrival date and time: 07/21/18 5956   First Provider Initiated Contact with Patient 07/21/18 1945      Chief Complaint  Patient presents with  . Hypertension   33 y.o. G2P2 a/p RCS 2 weeks ago presenting with elevated BP. Had virtual visit today  With severe range BPs (taken with home cuff). Had HA earlier but took Ibuprofen and it helped. Denies visual disturbances, epigastric pain, CP, SOB. Hx of severe PEC and received MgSO4 post delivery.   OB History    Gravida  2   Para  2   Term  2   Preterm  0   AB  0   Living  2     SAB  0   TAB  0   Ectopic  0   Multiple  0   Live Births  2           Past Medical History:  Diagnosis Date  . Gestational diabetes     Past Surgical History:  Procedure Laterality Date  . CESAREAN SECTION    . CESAREAN SECTION N/A 07/05/2018   Procedure: CESAREAN SECTION;  Surgeon: Hermina Staggers, MD;  Location: [facility] LD ORS;  Service: Obstetrics;  Laterality: N/A;    Family History  Problem Relation Age of Onset  . Diabetes Father     Social History   Tobacco Use  . Smoking status: Never Smoker  . Smokeless tobacco: Never Used  Substance Use Topics  . Alcohol use: Never    Frequency: Never  . Drug use: Never    Allergies: No Known Allergies  Medications Prior to Admission  Medication Sig Dispense Refill Last Dose  . ibuprofen (ADVIL) 800 MG tablet Take 1 tablet (800 mg total) by mouth every 8 (eight) hours as needed. 30 tablet 1 07/21/2018 at Unknown time  . oxyCODONE-acetaminophen (PERCOCET/ROXICET) 5-325 MG tablet Take 1 tablet by mouth every 6 (six) hours as needed for moderate pain. 30 tablet 0 07/21/2018 at Unknown time  . Prenatal Vit-Fe Fumarate-FA (PRENATAL PO) Take 1 tablet by mouth daily.   07/04/2018 at Unknown time    Review of Systems  Eyes: Negative for visual disturbance.  Respiratory: Negative for shortness of breath.   Cardiovascular: Negative for chest pain.   Neurological: Negative for headaches.   Physical Exam   Blood pressure (!) 142/73, pulse 81, temperature 98.3 F (36.8 C), temperature source Oral, resp. rate 18, height 5\' 3"  (1.6 m), weight 86.4 kg, last menstrual period 10/05/2017, SpO2 99 %, unknown if currently breastfeeding. Patient Vitals for the past 24 hrs:  BP Temp Temp src Pulse Resp SpO2 Height Weight  07/21/18 2100 (!) 142/73 - - 81 - 99 % - -  07/21/18 2046 (!) 141/75 - - 77 - - - -  07/21/18 2031 (!) 160/83 - - 63 - - - -  07/21/18 2016 (!) 175/94 - - (!) 55 - - - -  07/21/18 2013 (!) 180/89 - - (!) 57 - - - -  07/21/18 2001 (!) 194/105 - - (!) 55 - - - -  07/21/18 1946 (!) 164/98 - - (!) 57 - 100 % - -  07/21/18 1932 (!) 164/92 - - (!) 57 - - - -  07/21/18 1929 (!) 164/92 - - (!) 58 - - - -  07/21/18 1926 (!) 184/88 - - (!) 53 - - - -  07/21/18 1925 (!) 179/94 - - (!) 53 - 100 % - -  07/21/18 1924 (!) 179/94 98.3 F (36.8 C) Oral (!) 52 18 100 % 5\' 3"  (1.6 m) 86.4 kg   Physical Exam  Constitutional: She is oriented to person, place, and time. She appears well-developed and well-nourished. No distress.  HENT:  Head: Normocephalic and atraumatic.  Neck: Normal range of motion.  Cardiovascular: Normal rate.  Respiratory: Effort normal. No respiratory distress.  Musculoskeletal: Normal range of motion.  Neurological: She is alert and oriented to person, place, and time.  Psychiatric: She has a normal mood and affect.   Results for orders placed or performed during the hospital encounter of 07/21/18 (from the past 24 hour(s))  CBC     Status: Abnormal   Collection Time: 07/21/18  7:41 PM  Result Value Ref Range   WBC 5.5 4.0 - 10.5 K/uL   RBC 3.51 (L) 3.87 - 5.11 MIL/uL   Hemoglobin 10.6 (L) 12.0 - 15.0 g/dL   HCT 11.932.8 (L) 14.736.0 - 82.946.0 %   MCV 93.4 80.0 - 100.0 fL   MCH 30.2 26.0 - 34.0 pg   MCHC 32.3 30.0 - 36.0 g/dL   RDW 56.214.0 13.011.5 - 86.515.5 %   Platelets 414 (H) 150 - 400 K/uL   nRBC 0.0 0.0 - 0.2 %   Comprehensive metabolic panel     Status: Abnormal   Collection Time: 07/21/18  7:41 PM  Result Value Ref Range   Sodium 140 135 - 145 mmol/L   Potassium 3.9 3.5 - 5.1 mmol/L   Chloride 108 98 - 111 mmol/L   CO2 25 22 - 32 mmol/L   Glucose, Bld 96 70 - 99 mg/dL   BUN 14 6 - 20 mg/dL   Creatinine, Ser 7.840.74 0.44 - 1.00 mg/dL   Calcium 9.1 8.9 - 69.610.3 mg/dL   Total Protein 6.3 (L) 6.5 - 8.1 g/dL   Albumin 3.4 (L) 3.5 - 5.0 g/dL   AST 20 15 - 41 U/L   ALT 23 0 - 44 U/L   Alkaline Phosphatase 101 38 - 126 U/L   Total Bilirubin 0.5 0.3 - 1.2 mg/dL   GFR calc non Af Amer >60 >60 mL/min   GFR calc Af Amer >60 >60 mL/min   Anion gap 7 5 - 15   MAU Course  Procedures Orders Placed This Encounter  Procedures  . CBC    Standing Status:   Standing    Number of Occurrences:   1  . Comprehensive metabolic panel    Standing Status:   Standing    Number of Occurrences:   1  . Notify Physician    Confirmatory reading of BP> 160/110 15 minutes later    Standing Status:   Standing    Number of Occurrences:   1    Order Specific Question:   Notify Physician    Answer:   Temp greater than or equal to 100.4    Order Specific Question:   Notify Physician    Answer:   RR greater than 24 or less than 10    Order Specific Question:   Notify Physician    Answer:   HR greater than 120 or less than 50    Order Specific Question:   Notify Physician    Answer:   SBP greater than 160 mmHG or less than 80 mmHG    Order Specific Question:   Notify Physician    Answer:   DBP greater than 110 mmHG or less than 45 mmHG    Order Specific  Question:   Notify Physician    Answer:   Urinary output is less than for any 4 hour period  . Measure blood pressure    10 minutes after giving labetalol 40 MG IV dose.  Call MD if SBP >/= 160 or DBP >/= 110.    Standing Status:   Standing    Number of Occurrences:   1  . Discharge patient    Order Specific Question:   Discharge disposition    Answer:   01-Home  or Self Care [1]    Order Specific Question:   Discharge patient date    Answer:   07/21/2018   Meds ordered this encounter  Medications  . AND Linked Order Group   . NIFEdipine (PROCARDIA) capsule 10 mg   . NIFEdipine (PROCARDIA) capsule 20 mg   . NIFEdipine (PROCARDIA) capsule 20 mg   . labetalol (NORMODYNE) injection 40 mg  . NIFEdipine (PROCARDIA-XL/NIFEDICAL-XL) 24 hr tablet 30 mg  . NIFEdipine (ADALAT CC) 30 MG 24 hr tablet    Sig: Take 1 tablet (30 mg total) by mouth daily.    Dispense:  30 tablet    Refill:  1    Order Specific Question:   Supervising Provider    Answer:   CONSTANT, PEGGY [[id number]]   MDM Labs ordered and reviewed. BP controlled after 2 doses Procardia. Consult with Dr. Debroah Loop, will start Procardia XL. Stable for discharge home.   Assessment and Plan   1. Hypertension in pregnancy, preeclampsia, severe, delivered    Discharge home Follow up in 3 days for virtual visit- message sent Check BP daily, and send through baby Rx app Return precautions  Allergies as of 07/21/2018   No Known Allergies     Medication List    TAKE these medications   ibuprofen 800 MG tablet Commonly known as:  ADVIL Take 1 tablet (800 mg total) by mouth every 8 (eight) hours as needed.   NIFEdipine 30 MG 24 hr tablet Commonly known as:  ADALAT CC Take 1 tablet (30 mg total) by mouth daily.   oxyCODONE-acetaminophen 5-325 MG tablet Commonly known as:  PERCOCET/ROXICET Take 1 tablet by mouth every 6 (six) hours as needed for moderate pain.   PRENATAL PO Take 1 tablet by mouth daily.      Donette Larry, CNM 07/21/2018, 9:17 PM

## 2018-07-21 NOTE — MAU Note (Signed)
Urine sent to lab 

## 2018-07-22 ENCOUNTER — Telehealth: Payer: Self-pay | Admitting: Family Medicine

## 2018-07-22 NOTE — Telephone Encounter (Signed)
Attempted to reach patient to inform her about an appointment, and she needs to sign up for her MyChart. Left a message for her to call tomorrow after 8:00 am.

## 2018-07-22 NOTE — Telephone Encounter (Addendum)
Called by [facility] for elevated SBP: 160s. I called pt. Repeat was 140/80. No symptoms. Pt to follow up in office.

## 2018-07-24 ENCOUNTER — Telehealth: Payer: Medicaid Other

## 2018-07-25 NOTE — Progress Notes (Signed)
I have reviewed the chart and agree with nursing staff's documentation of this patient's encounter.  Shameeka Silliman, MD 07/25/2018 2:32 PM    

## 2018-07-28 ENCOUNTER — Telehealth: Payer: Self-pay | Admitting: General Practice

## 2018-07-28 DIAGNOSIS — O0990 Supervision of high risk pregnancy, unspecified, unspecified trimester: Secondary | ICD-10-CM

## 2018-07-28 NOTE — Telephone Encounter (Signed)
Received alert from [employer] patient had elevated BP over the weekend of 138/97. Reviewed other BP in [employer] 132/88, 133/89, 124/102 (?). Called patient and asked if she has been taking her medication and she states yes every day at nighttime. Asked patient if she has had dizziness, headaches, or blurry vision and she states no. Asked patient to check BP this morning- 124/93. Discussed with Dr Rip Harbour who states patient should continue medication, no further intervention needed at this time. Discussed with patient. Patient verbalized understanding & had no questions. Patient will follow up at pp visit on 6/15.

## 2018-07-29 ENCOUNTER — Telehealth: Payer: Self-pay | Admitting: *Deleted

## 2018-07-29 NOTE — Telephone Encounter (Signed)
Received call from [employer] regarding elevated blood pressure recording of 124/90 on 6/9 @ 1223am.  Per chart review, pt was called yesterday 6/8 regarding her elevated blood pressures and her chart reviewed with Dr. Rip Harbour who advised no change and pt to follow up at her postpartum visit on 6/15.  Reviewed new information with Dr. Juleen China who advised no changes to Dr. Marjory Lies plan of care.

## 2018-08-01 ENCOUNTER — Other Ambulatory Visit: Payer: Self-pay

## 2018-08-04 ENCOUNTER — Encounter: Payer: Self-pay | Admitting: General Practice

## 2018-08-04 ENCOUNTER — Ambulatory Visit: Payer: Medicaid Other | Admitting: Advanced Practice Midwife

## 2018-08-04 ENCOUNTER — Telehealth: Payer: Self-pay | Admitting: Advanced Practice Midwife

## 2018-08-04 NOTE — Telephone Encounter (Signed)
Called the patient to inform of the changes in the appointment time due to changes in the office with the provider's scheduled. Called the patient twice, also sending a message via mychart.

## 2018-08-05 ENCOUNTER — Other Ambulatory Visit: Payer: Self-pay

## 2018-08-05 ENCOUNTER — Telehealth (INDEPENDENT_AMBULATORY_CARE_PROVIDER_SITE_OTHER): Payer: Medicaid Other

## 2018-08-05 ENCOUNTER — Telehealth: Payer: Medicaid Other

## 2018-08-05 NOTE — Progress Notes (Signed)
I connected with  Laurie Walsh on 08/05/18 at 10:35 AM EDT by telephone and verified that I am speaking with the correct person using two identifiers.   I discussed the limitations, risks, security and privacy concerns of performing an evaluation and management service by telephone and the availability of in person appointments. I also discussed with the patient that there may be a patient responsible charge related to this service. The patient expressed understanding and agreed to proceed.  Aviva Signs [name], [place] 08/05/2018  10:33 AM   Breast and bottle gerber good start Repeat Csection No vaginal bleeding  IUD  [name] is neg

## 2018-08-05 NOTE — Progress Notes (Signed)
TELEHEALTH VIRTUAL POSTPARTUM VISIT ENCOUNTER NOTE  I connected with Laurie Walsh on 08/05/18 at 10:35 AM EDT by MyChart Virtual Visit at home and verified that I am speaking with the correct person using two identifiers.   I discussed the limitations, risks, security and privacy concerns of performing an evaluation and management service by telephone and the availability of in person appointments. I also discussed with the patient that there may be a patient responsible charge related to this service. The patient expressed understanding and agreed to proceed.  Appointment Date: 08/05/2018  [facility]  Chief Complaint:  Chief Complaint  Patient presents with  . Postpartum Care    History of Present Illness: Laurie Walsh is a 33 y.o. African-American [id number] (Patient's last menstrual period was 10/05/2017 (exact date).), seen for the above chief complaint. Her past medical history is significant for n/a   She is s/p repeat cesarean section on 5/16 at 39 weeks; she was discharged to home on PPD#2. Pregnancy complicated by severe preeclampsia. Baby is doing well.  Complains of n/a  Vaginal bleeding or discharge: No  Mode of feeding infant: Bottle Intercourse: No  Contraception: IUD PP depression s/s: No .  Any bowel or bladder issues: No  Pap smear: no abnormalities (date: 12/19)  Review of Systems: Positive for n/a. Her 12 point review of systems is negative or as noted in the History of Present Illness.  Patient Active Problem List   Diagnosis Date Noted  . H/O: C-section 05/15/2018    Medications Laurie Walsh had no medications administered during this visit. Current Outpatient Medications  Medication Sig Dispense Refill  . ibuprofen (ADVIL) 800 MG tablet Take 1 tablet (800 mg total) by mouth every 8 (eight) hours as needed. 30 tablet 1  . NIFEdipine (ADALAT CC) 30 MG 24 hr tablet Take 1 tablet (30 mg total) by mouth daily. 30 tablet 1  .  oxyCODONE-acetaminophen (PERCOCET/ROXICET) 5-325 MG tablet Take 1 tablet by mouth every 6 (six) hours as needed for moderate pain. (Patient not taking: Reported on 08/05/2018) 30 tablet 0  . Prenatal Vit-Fe Fumarate-FA (PRENATAL PO) Take 1 tablet by mouth daily.     No current facility-administered medications for this visit.     Allergies Patient has no known allergies.  Physical Exam:  General:  Alert, oriented and cooperative.   Mental Status: Normal mood and affect perceived. Normal judgment and thought content.  Rest of physical exam deferred due to type of encounter  PP Depression Screening:   Edinburgh Postnatal Depression Scale - 08/05/18 1037      Edinburgh Postnatal Depression Scale:  In the Past 7 Days   I have been able to laugh and see the funny side of things.  0    I have looked forward with enjoyment to things.  0    I have blamed myself unnecessarily when things went wrong.  0    I have been anxious or worried for no good reason.  0    I have felt scared or panicky for no good reason.  0    Things have been getting on top of me.  0    I have been so unhappy that I have had difficulty sleeping.  0    I have felt sad or miserable.  0    I have been so unhappy that I have been crying.  0    The thought of harming myself has occurred to me.  0  Edinburgh Postnatal Depression Scale Total  0       Assessment:Patient is a 33 y.o. [id number] who is 4 weeks postpartum from a repeat cesarean section.  She is doing well.   Plan:  1. Postpartum care and examination -Patient desires IUD for contraception. Will schedule as soon as possible -Will need PP GTT at visit for IUD   RTC as soon as possible for GTT and IUD insertion  I discussed the assessment and treatment plan with the patient. The patient was provided an opportunity to ask questions and all were answered. The patient agreed with the plan and demonstrated an understanding of the instructions.   The patient was  advised to call back or seek an in-person evaluation/go to the ED for any concerning postpartum symptoms.  I provided 10 minutes of non-face-to-face time during this encounter.   Rolm Bookbinderaroline M Brandyn Lowrey, CNM [facility], [facility]

## 2018-08-06 ENCOUNTER — Telehealth: Payer: Medicaid Other

## 2018-08-25 ENCOUNTER — Telehealth: Payer: Self-pay | Admitting: Obstetrics & Gynecology

## 2018-08-25 NOTE — Telephone Encounter (Signed)
Called the patient to complete the pre-screen. The patient answered no to COVID19 symptoms and/or being previously diagnosed. Informed the patient of the wearing a face mask, sanitizing hands at the sanitizing station upon entering our office, and no visitors or children are allowed due to the COVID19 restrictions. The patient verbalized understanding. °

## 2018-08-26 ENCOUNTER — Telehealth: Payer: Self-pay | Admitting: Medical

## 2018-08-26 ENCOUNTER — Encounter: Payer: Self-pay | Admitting: Medical

## 2018-08-26 ENCOUNTER — Ambulatory Visit: Payer: Medicaid Other | Admitting: Medical

## 2018-08-26 ENCOUNTER — Other Ambulatory Visit: Payer: Self-pay

## 2018-08-26 VITALS — BP 145/86 | HR 74 | Temp 98.5°F | Ht 63.0 in | Wt 186.9 lb

## 2018-08-26 DIAGNOSIS — Z5309 Procedure and treatment not carried out because of other contraindication: Secondary | ICD-10-CM

## 2018-08-26 LAB — POCT PREGNANCY, URINE: Preg Test, Ur: NEGATIVE

## 2018-08-26 NOTE — Patient Instructions (Signed)

## 2018-08-26 NOTE — Progress Notes (Signed)
Patient presents to [facility] today for IUD insertion. Patient states that she had unprotected intercourse yesterday. Advised that we will need to reschedule visit for 2 weeks from yesterday at earliest. Patient advised to abstain or use condoms every time until IUD in placed.   Luvenia Redden, PA-C 08/26/18 4:40 PM

## 2018-09-03 ENCOUNTER — Other Ambulatory Visit: Payer: Self-pay | Admitting: *Deleted

## 2018-09-03 DIAGNOSIS — Z8632 Personal history of gestational diabetes: Secondary | ICD-10-CM

## 2018-09-09 ENCOUNTER — Other Ambulatory Visit: Payer: Self-pay

## 2018-09-09 ENCOUNTER — Encounter: Payer: Self-pay | Admitting: Medical

## 2018-09-09 ENCOUNTER — Ambulatory Visit (INDEPENDENT_AMBULATORY_CARE_PROVIDER_SITE_OTHER): Payer: Medicaid Other | Admitting: Medical

## 2018-09-09 ENCOUNTER — Other Ambulatory Visit: Payer: Medicaid Other

## 2018-09-09 VITALS — BP 147/101 | HR 62

## 2018-09-09 DIAGNOSIS — R030 Elevated blood-pressure reading, without diagnosis of hypertension: Secondary | ICD-10-CM

## 2018-09-09 DIAGNOSIS — Z98891 History of uterine scar from previous surgery: Secondary | ICD-10-CM

## 2018-09-09 DIAGNOSIS — O24410 Gestational diabetes mellitus in pregnancy, diet controlled: Secondary | ICD-10-CM

## 2018-09-09 DIAGNOSIS — Z8632 Personal history of gestational diabetes: Secondary | ICD-10-CM | POA: Diagnosis not present

## 2018-09-09 DIAGNOSIS — Z30430 Encounter for insertion of intrauterine contraceptive device: Secondary | ICD-10-CM

## 2018-09-09 LAB — POCT PREGNANCY, URINE: Preg Test, Ur: NEGATIVE

## 2018-09-09 MED ORDER — NIFEDIPINE ER 30 MG PO TB24: 30.0000 mg | ORAL_TABLET | Freq: Every day | ORAL | 1 refills | Status: AC

## 2018-09-09 MED ORDER — LEVONORGESTREL 19.5 MCG/DAY IU IUD
INTRAUTERINE_SYSTEM | Freq: Once | INTRAUTERINE | Status: AC
  Administered 2018-09-09: 1 via INTRAUTERINE

## 2018-09-09 NOTE — Patient Instructions (Signed)
Levonorgestrel intrauterine device (IUD) What is this medicine? LEVONORGESTREL IUD (LEE voe nor jes trel) is a contraceptive (birth control) device. The device is placed inside the uterus by a healthcare professional. It is used to prevent pregnancy. This device can also be used to treat heavy bleeding that occurs during your period. This medicine may be used for other purposes; ask your health care provider or pharmacist if you have questions. COMMON BRAND NAME(S): Kyleena, LILETTA, Mirena, Skyla What should I tell my health care provider before I take this medicine? They need to know if you have any of these conditions:  abnormal Pap smear  cancer of the breast, uterus, or cervix  diabetes  endometritis  genital or pelvic infection now or in the past  have more than one sexual partner or your partner has more than one partner  heart disease  history of an ectopic or tubal pregnancy  immune system problems  IUD in place  liver disease or tumor  problems with blood clots or take blood-thinners  seizures  use intravenous drugs  uterus of unusual shape  vaginal bleeding that has not been explained  an unusual or allergic reaction to levonorgestrel, other hormones, silicone, or polyethylene, medicines, foods, dyes, or preservatives  pregnant or trying to get pregnant  breast-feeding How should I use this medicine? This device is placed inside the uterus by a health care professional. Talk to your pediatrician regarding the use of this medicine in children. Special care may be needed. Overdosage: If you think you have taken too much of this medicine contact a poison control center or emergency room at once. NOTE: This medicine is only for you. Do not share this medicine with others. What if I miss a dose? This does not apply. Depending on the brand of device you have inserted, the device will need to be replaced every 3 to 6 years if you wish to continue using this type  of birth control. What may interact with this medicine? Do not take this medicine with any of the following medications:  amprenavir  bosentan  fosamprenavir This medicine may also interact with the following medications:  aprepitant  armodafinil  barbiturate medicines for inducing sleep or treating seizures  bexarotene  boceprevir  griseofulvin  medicines to treat seizures like carbamazepine, ethotoin, felbamate, oxcarbazepine, phenytoin, topiramate  modafinil  pioglitazone  rifabutin  rifampin  rifapentine  some medicines to treat HIV infection like atazanavir, efavirenz, indinavir, lopinavir, nelfinavir, tipranavir, ritonavir  St. John's wort  warfarin This list may not describe all possible interactions. Give your health care provider a list of all the medicines, herbs, non-prescription drugs, or dietary supplements you use. Also tell them if you smoke, drink alcohol, or use illegal drugs. Some items may interact with your medicine. What should I watch for while using this medicine? Visit your doctor or health care professional for regular check ups. See your doctor if you or your partner has sexual contact with others, becomes HIV positive, or gets a sexual transmitted disease. This product does not protect you against HIV infection (AIDS) or other sexually transmitted diseases. You can check the placement of the IUD yourself by reaching up to the top of your vagina with clean fingers to feel the threads. Do not pull on the threads. It is a good habit to check placement after each menstrual period. Call your doctor right away if you feel more of the IUD than just the threads or if you cannot feel the threads at   all. The IUD may come out by itself. You may become pregnant if the device comes out. If you notice that the IUD has come out use a backup birth control method like condoms and call your health care provider. Using tampons will not change the position of the  IUD and are okay to use during your period. This IUD can be safely scanned with magnetic resonance imaging (MRI) only under specific conditions. Before you have an MRI, tell your healthcare provider that you have an IUD in place, and which type of IUD you have in place. What side effects may I notice from receiving this medicine? Side effects that you should report to your doctor or health care professional as soon as possible:  allergic reactions like skin rash, itching or hives, swelling of the face, lips, or tongue  fever, flu-like symptoms  genital sores  high blood pressure  no menstrual period for 6 weeks during use  pain, swelling, warmth in the leg  pelvic pain or tenderness  severe or sudden headache  signs of pregnancy  stomach cramping  sudden shortness of breath  trouble with balance, talking, or walking  unusual vaginal bleeding, discharge  yellowing of the eyes or skin Side effects that usually do not require medical attention (report to your doctor or health care professional if they continue or are bothersome):  acne  breast pain  change in sex drive or performance  changes in weight  cramping, dizziness, or faintness while the device is being inserted  headache  irregular menstrual bleeding within first 3 to 6 months of use  nausea This list may not describe all possible side effects. Call your doctor for medical advice about side effects. You may report side effects to FDA at 1-800-FDA-1088. Where should I keep my medicine? This does not apply. NOTE: This sheet is a summary. It may not cover all possible information. If you have questions about this medicine, talk to your doctor, pharmacist, or health care provider.  2020 Elsevier/Gold Standard (2017-12-17 13:22:01) Intrauterine Device Insertion An intrauterine device (IUD) is a medical device that gets inserted into the uterus to prevent pregnancy. It is a small, T-shaped device that has one  or two nylon strings hanging down from it. The strings hang out of the lower part of the uterus (cervix) to allow for future IUD removal. There are two types of IUDs available:  Copper IUD. This type of IUD has copper wire wrapped around it. Copper makes the uterus and fallopian tubes produce a fluid that kills sperm. A copper IUD may last up to 10 years.  Hormone IUD. This type of IUD is made of plastic and contains the hormone progestin (synthetic progesterone). The hormone thickens mucus in the cervix and prevents sperm from entering the uterus. It also thins the uterine lining to prevent implantation of a fertilized egg. The hormone can weaken or kill the sperm that get into the uterus. A hormone IUD may last 3-5 years. Tell a health care provider about:  Any allergies you have.  All medicines you are taking, including vitamins, herbs, eye drops, creams, and over-the-counter medicines.  Any problems you or family members have had with anesthetic medicines.  Any blood disorders you have.  Any surgeries you have had.  Any medical conditions you have, including any STIs (sexually transmitted infections) you may have.  Whether you are pregnant or may be pregnant. What are the risks? Generally, this is a safe procedure. However, problems may occur, including:  Infection.  Bleeding.  Allergic reactions to medicines.  Accidental puncture (perforation) of the uterus, or damage to other structures or organs.  Accidental placement of the IUD either in the muscle layer of the uterus (myometrium) or outside the uterus.  The IUD falling out of the uterus (expulsion). This is more common among women who have recently had a child.  Pregnancy that happens in the fallopian tube (ectopic pregnancy).  Infection of the uterus and fallopian tubes (pelvic inflammatory disease). What happens before the procedure?  Schedule the IUD insertion for when you will have your menstrual period or right  after, to make sure you are not pregnant. Placement of the IUD is better tolerated shortly after a menstrual cycle.  Follow instructions from your health care provider about eating or drinking restrictions.  Ask your health care provider about changing or stopping your regular medicines. This is especially important if you are taking diabetes medicines or blood thinners.  You may get a pain reliever to take before the procedure.  You may have tests for: ? Pregnancy. A pregnancy test involves having a urine sample taken. ? STIs. Placing an IUD in someone who has an STI can make the infection worse. ? Cervical cancer. You may have a Pap test to check for this type of cancer. This means collecting cells from your cervix to be examined under a microscope.  You may have a physical exam to determine the size and position of your uterus. The procedure may vary among health care providers and hospitals. What happens during the procedure?  A tool (speculum) will be placed in your vagina and widened so that your health care provider can see your cervix.  Medicine may be applied to your cervix to help lower your risk of infection (antiseptic medicine).  You may be given an anesthetic medicine to numb each side of your cervix (intracervical block or paracervical block). This medicine is usually given by an injection into the cervix.  A tool (uterine sound) will be inserted into your uterus to determine the length of your uterus and the direction that your uterus may be tilted.  A slim instrument or tube (IUD inserter) that holds the IUD will be inserted into your vagina, through your cervical canal, and into your uterus.  The IUD will be placed in the uterus, and the IUD inserter will be removed.  The strings that are attached to the IUD will be trimmed so that they lie just below the cervix. The procedure may vary among health care providers and hospitals. What happens after the procedure?  You  may have bleeding after the procedure. This is normal. It varies from light bleeding (spotting) for a few days to menstrual-like bleeding.  You may have cramping and pain.  You may feel dizzy or light-headed.  You may have lower back pain. Summary  An intrauterine device (IUD) is a small, T-shaped device that has one or two nylon strings hanging down from it.  Two types of IUDs are available. You may have a copper IUD or a hormone IUD.  Schedule the IUD insertion for when you will have your menstrual period or right after, to make sure you are not pregnant. Placement of the IUD is better tolerated shortly after a menstrual cycle.  You may have bleeding after the procedure. This is normal. It varies from light spotting for a few days to menstrual-like bleeding. This information is not intended to replace advice given to you by your health care  provider. Make sure you discuss any questions you have with your health care provider. Document Released: 10/04/2010 Document Revised: 01/18/2017 Document Reviewed: 12/28/2015 Elsevier Patient Education  2020 [employer].

## 2018-09-09 NOTE — Progress Notes (Signed)
   GYNECOLOGY CLINIC PROCEDURE NOTE  Ms. Laurie Walsh is a 33 y.o. [id number] here for Mirena IUD insertion. No GYN concerns.    IUD Insertion Procedure Note Patient identified, informed consent performed.  Discussed risks of irregular bleeding, cramping, infection, malpositioning or misplacement of the IUD outside the uterus which may require further procedure such as laparoscopy. Time out was performed.  Urine pregnancy test negative.  Speculum placed in the vagina.  Cervix visualized.  Cleaned with Betadine x 2.  Grasped anteriorly with a single tooth tenaculum.  Uterus sounded to 6 cm.  Liletta IUD placed per manufacturer's recommendations.  Strings trimmed to 3 cm. Tenaculum was removed, good hemostasis noted.  Patient tolerated procedure well.   Patient was given post-procedure instructions.  She was advised to be have backup contraception for one week.  Patient was also asked to check IUD strings periodically.   Patient was supposed to get 2 hour PP GTT today. There was a lab error and patient will have to return at a later date.  BP is severely elevated x 2. Patient was discharged on Nifedipine PP and discontinued ~ 1 month ago. She denies headache, visual changes or chest pain today.  Discussed with Dr. Roselie Awkward. Restart anti-hypertensive and refer to PCP.    Luvenia Redden, PA-C 09/09/2018 9:59 AM

## 2018-09-23 NOTE — Telephone Encounter (Signed)
Opened in error

## 2018-09-30 ENCOUNTER — Encounter: Payer: Self-pay | Admitting: *Deleted

## 2018-10-20 ENCOUNTER — Telehealth: Payer: Self-pay

## 2018-10-20 NOTE — Telephone Encounter (Signed)
Called patient to do their pre-visit COVID screening.  Patient states that she would like to cancel appointment. Would not like to reschedule at this time.

## 2018-10-21 ENCOUNTER — Ambulatory Visit: Payer: Medicaid Other

## 2018-12-04 NOTE — Addendum Note (Signed)
Addended by: Carylon Perches on: 12/04/2018 11:58 AM   Modules accepted: Orders
